# Patient Record
Sex: Male | Born: 1965 | Race: Black or African American | Hispanic: No | Smoking: Never smoker
Health system: Southern US, Community
[De-identification: ages and names within clinical notes are randomized; demographics above are authoritative.]

## PROBLEM LIST (undated history)

## (undated) DIAGNOSIS — E119 Type 2 diabetes mellitus without complications: Secondary | ICD-10-CM

## (undated) DIAGNOSIS — Z789 Other specified health status: Secondary | ICD-10-CM

## (undated) DIAGNOSIS — I10 Essential (primary) hypertension: Secondary | ICD-10-CM

## (undated) HISTORY — PX: NO PAST SURGERIES: SHX2092

---

## 2011-01-15 ENCOUNTER — Encounter: Payer: Self-pay | Admitting: Emergency Medicine

## 2011-01-15 ENCOUNTER — Emergency Department (HOSPITAL_COMMUNITY)
Admission: EM | Admit: 2011-01-15 | Discharge: 2011-01-15 | Disposition: A | Discharge status: Home / Self Care | Payer: BC Managed Care – PPO | Attending: Emergency Medicine | Admitting: Emergency Medicine

## 2011-01-15 DIAGNOSIS — N498 Inflammatory disorders of other specified male genital organs: Secondary | ICD-10-CM | POA: Insufficient documentation

## 2011-01-15 DIAGNOSIS — N492 Inflammatory disorders of scrotum: Secondary | ICD-10-CM

## 2011-01-15 MED ORDER — DOXYCYCLINE HYCLATE 100 MG PO CAPS: 100.0000 mg | ORAL_CAPSULE | Freq: Two times a day (BID) | ORAL | Status: AC

## 2011-01-15 MED ORDER — DOXYCYCLINE HYCLATE 100 MG PO TABS
100.0000 mg | ORAL_TABLET | Freq: Once | ORAL | Status: AC
  Administered 2011-01-15: 100 mg via ORAL
  Filled 2011-01-15: qty 1

## 2011-01-15 NOTE — ED Provider Notes (Signed)
History     CSN: [id number] Arrival date & time: 01/15/2011  8:36 AM  Chief Complaint  Patient presents with  . Abscess    (Consider location/radiation/quality/duration/timing/severity/associated sxs/prior treatment) HPI Comments: Pt states he mashed and popped the first lesion.  Another has since emerged.  Patient is a 45 y.o. male presenting with abscess. The history is provided by the patient. No language interpreter was used.  Abscess  This is a new problem. Episode onset: several days ago. The problem has been gradually worsening. Affected Location: scrotum. The problem is moderate. The abscess is characterized by swelling. Associated symptoms include a fever.    History reviewed. No pertinent past medical history.  History reviewed. No pertinent past surgical history.  History reviewed. No pertinent family history.  History  Substance Use Topics  . Smoking status: Not on file  . Smokeless tobacco: Not on file  . Alcohol Use: No      Review of Systems  Constitutional: Positive for fever.  Skin:       abscess  All other systems reviewed and are negative.    Allergies  Review of patient's allergies indicates no known allergies.  Home Medications   Current Outpatient Rx  Name Route Sig Dispense Refill  . ASPIRIN-SALICYLAMIDE-CAFFEINE 650-195-33.3 MG PO PACK Oral Take 1 packet by mouth every 4 (four) hours as needed. Pain       BP 144/97  Pulse 114  Temp(Src) 99.3 F (37.4 C) (Oral)  Resp 20  Ht 5\' 9"  (1.753 m)  Wt 195 lb (88.451 kg)  BMI 28.80 kg/m2  SpO2 100%  Physical Exam  Nursing note and vitals reviewed. Constitutional: He is oriented to person, place, and time. Vital signs are normal. He appears well-developed and well-nourished. No distress.  HENT:  Head: Normocephalic and atraumatic.  Right Ear: External ear normal.  Left Ear: External ear normal.  Nose: Nose normal.  Mouth/Throat: No oropharyngeal exudate.  Eyes: Conjunctivae and EOM  are normal. Pupils are equal, round, and reactive to light. Right eye exhibits no discharge. Left eye exhibits no discharge. No scleral icterus.  Neck: Normal range of motion. Neck supple. No JVD present. No tracheal deviation present. No thyromegaly present.  Cardiovascular: Normal rate, regular rhythm, normal heart sounds, intact distal pulses and normal pulses.  Exam reveals no gallop and no friction rub.   No murmur heard. Pulmonary/Chest: Effort normal and breath sounds normal. No stridor. No respiratory distress. He has no wheezes. He has no rales. He exhibits no tenderness.  Abdominal: Soft. Normal appearance and bowel sounds are normal. He exhibits no distension and no mass. There is no tenderness. There is no rebound and no guarding.  Genitourinary:        Musculoskeletal: Normal range of motion. He exhibits no edema and no tenderness.  Lymphadenopathy:    He has no cervical adenopathy.  Neurological: He is alert and oriented to person, place, and time. He has normal reflexes. Coordination normal. GCS eye subscore is 4. GCS verbal subscore is 5. GCS motor subscore is 6.  Skin: Skin is warm and dry. No rash noted. He is not diaphoretic.  Psychiatric: He has a normal mood and affect. His speech is normal and behavior is normal. Judgment and thought content normal. Cognition and memory are normal.    ED Course  Procedures (including critical care time)  Labs Reviewed - No data to display No results found.   No diagnosis found.    MDM  Worthy Rancher, [place] 01/15/11 [phone]

## 2011-01-15 NOTE — ED Provider Notes (Signed)
Evaluation and management procedures were performed by the mid-level provider (PA/NP/CNM) under my supervision/collaboration. I was present and available during the ED course. Miguel Carelock Y.   Gavin Pound. Oletta Lamas, MD 01/15/11 [phone]

## 2011-01-15 NOTE — ED Notes (Signed)
Pt reports boil under his scrotum that is the size of a "quarter".  Pt reports "it never got a head on it, so i couldn't pop it".  Pt is now febrile and tachycardic.

## 2011-01-15 NOTE — ED Notes (Signed)
Pt c/o knot near his scrotum since Saturday.

## 2011-01-24 ENCOUNTER — Other Ambulatory Visit (HOSPITAL_COMMUNITY): Payer: Self-pay | Admitting: Family Medicine

## 2011-01-24 DIAGNOSIS — R609 Edema, unspecified: Secondary | ICD-10-CM

## 2011-01-29 ENCOUNTER — Other Ambulatory Visit (HOSPITAL_COMMUNITY): Payer: Self-pay | Admitting: Family Medicine

## 2011-01-29 ENCOUNTER — Ambulatory Visit (HOSPITAL_COMMUNITY)
Admission: RE | Admit: 2011-01-29 | Discharge: 2011-01-29 | Disposition: A | Discharge status: Home / Self Care | Payer: BC Managed Care – PPO | Source: Ambulatory Visit | Attending: Family Medicine | Admitting: Family Medicine

## 2011-01-29 DIAGNOSIS — R609 Edema, unspecified: Secondary | ICD-10-CM

## 2011-01-29 DIAGNOSIS — N5089 Other specified disorders of the male genital organs: Secondary | ICD-10-CM | POA: Insufficient documentation

## 2011-07-08 ENCOUNTER — Other Ambulatory Visit (HOSPITAL_COMMUNITY): Payer: Self-pay | Admitting: Urology

## 2011-07-08 DIAGNOSIS — N50812 Left testicular pain: Secondary | ICD-10-CM

## 2011-07-10 ENCOUNTER — Ambulatory Visit (HOSPITAL_COMMUNITY)
Admission: RE | Admit: 2011-07-10 | Discharge: 2011-07-10 | Disposition: A | Discharge status: Home / Self Care | Payer: BC Managed Care – PPO | Source: Ambulatory Visit | Attending: Urology | Admitting: Urology

## 2011-07-10 ENCOUNTER — Other Ambulatory Visit (HOSPITAL_COMMUNITY): Payer: Self-pay | Admitting: Urology

## 2011-07-10 DIAGNOSIS — N508 Other specified disorders of male genital organs: Secondary | ICD-10-CM | POA: Insufficient documentation

## 2011-07-10 DIAGNOSIS — N433 Hydrocele, unspecified: Secondary | ICD-10-CM | POA: Insufficient documentation

## 2011-07-10 DIAGNOSIS — N50812 Left testicular pain: Secondary | ICD-10-CM

## 2015-09-11 ENCOUNTER — Ambulatory Visit (HOSPITAL_COMMUNITY)
Admission: EM | Admit: 2015-09-11 | Discharge: 2015-09-11 | Disposition: A | Discharge status: Home / Self Care | Payer: BLUE CROSS/BLUE SHIELD | Attending: Emergency Medicine | Admitting: Emergency Medicine

## 2015-09-11 ENCOUNTER — Encounter (HOSPITAL_COMMUNITY): Payer: Self-pay | Admitting: Emergency Medicine

## 2015-09-11 DIAGNOSIS — L03116 Cellulitis of left lower limb: Secondary | ICD-10-CM

## 2015-09-11 MED ORDER — CEFTRIAXONE SODIUM 1 G IJ SOLR
1.0000 g | Freq: Once | INTRAMUSCULAR | Status: AC
  Administered 2015-09-11: 1 g via INTRAMUSCULAR

## 2015-09-11 MED ORDER — CEFTRIAXONE SODIUM 1 G IJ SOLR
INTRAMUSCULAR | Status: AC
  Filled 2015-09-11: qty 10

## 2015-09-11 MED ORDER — SULFAMETHOXAZOLE-TRIMETHOPRIM 800-160 MG PO TABS: 1.0000 | ORAL_TABLET | Freq: Two times a day (BID) | ORAL | Status: DC

## 2015-09-11 MED ORDER — HYDROCODONE-ACETAMINOPHEN 5-325 MG PO TABS: 1.0000 | ORAL_TABLET | Freq: Four times a day (QID) | ORAL | Status: DC | PRN

## 2015-09-11 MED ORDER — CEPHALEXIN 500 MG PO CAPS: 500.0000 mg | ORAL_CAPSULE | Freq: Four times a day (QID) | ORAL | Status: DC

## 2015-09-11 NOTE — ED Notes (Signed)
Pt has swelling, redness and pain in his left upper medial leg and groin.  He noticed some pain in his groin on Monday.  Since then the pain and redness has spread.  The pt has a low grade fever of 101.7 today.

## 2015-09-11 NOTE — ED Provider Notes (Signed)
CSN: [id number]     Arrival date & time 09/11/15  1716 History   First MD Initiated Contact with Patient 09/11/15 1754     Chief Complaint  Patient presents with  . Leg Swelling   (Consider location/radiation/quality/duration/timing/severity/associated sxs/prior Treatment) HPI  He is a 50 year old man here for evaluation of left leg issue. He states this started yesterday morning with some pain and swelling in the left groin. He states now the left inner thigh is red and tender. It is worse up by the groin. No involvement of the scrotum. No nausea or vomiting. He does have a fever here of 101.7.  History reviewed. No pertinent past medical history. History reviewed. No pertinent past surgical history. History reviewed. No pertinent family history. Social History  Substance Use Topics  . Smoking status: Never Smoker   . Smokeless tobacco: None  . Alcohol Use: No    Review of Systems As in history of present illness Allergies  Review of patient's allergies indicates no known allergies.  Home Medications   Prior to Admission medications   Medication Sig Start Date End Date Taking? Authorizing Provider  Aspirin-Salicylamide-Caffeine (BC FAST PAIN RELIEF) 650-195-33.3 MG PACK Take 1 packet by mouth every 4 (four) hours as needed. Pain     Historical Provider, MD  cephALEXin (KEFLEX) 500 MG capsule Take 1 capsule (500 mg total) by mouth 4 (four) times daily. 09/11/15   Charm RingsErin J Kemp Gomes, MD  HYDROcodone-acetaminophen (NORCO) 5-325 MG tablet Take 1 tablet by mouth every 6 (six) hours as needed for moderate pain. 09/11/15   Charm RingsErin J Stefany Starace, MD  sulfamethoxazole-trimethoprim (BACTRIM DS,SEPTRA DS) 800-160 MG tablet Take 1 tablet by mouth 2 (two) times daily. 09/11/15 09/18/15  Charm RingsErin J Chaitanya Amedee, MD   Meds Ordered and Administered this Visit   Medications  cefTRIAXone (ROCEPHIN) injection 1 g (1 g Intramuscular Given 09/11/15 1830)    BP 195/89 mmHg  Pulse 105  Temp(Src) 101.7 F (38.7 C) (Oral)  Resp 16   SpO2 97% No data found.   Physical Exam  Constitutional: He is oriented to person, place, and time. He appears well-developed and well-nourished. No distress.  Cardiovascular: Normal rate.   Pulmonary/Chest: Effort normal.  Neurological: He is alert and oriented to person, place, and time.  Skin:  He has a 18 x 30 cm patch of erythema on the left inner thigh stretching from the groin crease to the knee. This is warm and swollen. There is an area of firm induration in the groin, but no fluctuance. No involvement of the scrotum.    ED Course  Procedures (including critical care time)  Labs Review Labs Reviewed - No data to display  Imaging Review No results found.   MDM   1. Cellulitis of left lower extremity    I have marked the borders of erythema. Shot of Rocephin given here. We'll treat with Keflex and Bactrim to cover MRSA given fever and rapid progression. If he has continued fevers or the erythema is spreading beyond the borders, he will go to the emergency room.    Charm RingsErin J Mason Burleigh, MD 09/11/15 [phone]

## 2015-09-11 NOTE — Discharge Instructions (Signed)
Cellulitis Cellulitis is an infection of the skin and the tissue under the skin. The infected area is usually red and tender. This happens most often in the arms and lower legs. HOME CARE   Take your antibiotic medicine as told. Finish the medicine even if you start to feel better.  Keep the infected arm or leg raised (elevated).  Put a warm cloth on the area up to 4 times per day.  Only take medicines as told by your doctor.  Keep all doctor visits as told. GET HELP IF:  You see red streaks on the skin coming from the infected area.  Your red area gets bigger or turns a dark color.  Your bone or joint under the infected area is painful after the skin heals.  Your infection comes back in the same area or different area.  You have a puffy (swollen) bump in the infected area.  You have new symptoms.  Your fever persists for more than 2 days GET HELP RIGHT AWAY IF:   You feel very sleepy.  You throw up (vomit) or have watery poop (diarrhea).  You feel sick and have muscle aches and pains.   This information is not intended to replace advice given to you by your health care provider. Make sure you discuss any questions you have with your health care provider.   Document Released: 09/10/2007 Document Revised: 12/13/2014 Document Reviewed: 06/09/2011 Elsevier Interactive Patient Education [employer]2016 [employer]vier Inc.

## 2015-09-14 ENCOUNTER — Encounter (HOSPITAL_COMMUNITY): Payer: Self-pay

## 2015-09-14 ENCOUNTER — Observation Stay (HOSPITAL_COMMUNITY): Payer: BLUE CROSS/BLUE SHIELD

## 2015-09-14 ENCOUNTER — Inpatient Hospital Stay (HOSPITAL_COMMUNITY)
Admission: EM | Admit: 2015-09-14 | Discharge: 2015-09-19 | DRG: 603 | Disposition: A | Discharge status: Home Health | Payer: BLUE CROSS/BLUE SHIELD | Attending: Internal Medicine | Admitting: Internal Medicine

## 2015-09-14 DIAGNOSIS — I10 Essential (primary) hypertension: Secondary | ICD-10-CM | POA: Diagnosis present

## 2015-09-14 DIAGNOSIS — E11628 Type 2 diabetes mellitus with other skin complications: Secondary | ICD-10-CM

## 2015-09-14 DIAGNOSIS — L03116 Cellulitis of left lower limb: Secondary | ICD-10-CM

## 2015-09-14 DIAGNOSIS — I129 Hypertensive chronic kidney disease with stage 1 through stage 4 chronic kidney disease, or unspecified chronic kidney disease: Secondary | ICD-10-CM | POA: Diagnosis present

## 2015-09-14 DIAGNOSIS — I159 Secondary hypertension, unspecified: Secondary | ICD-10-CM

## 2015-09-14 DIAGNOSIS — E1165 Type 2 diabetes mellitus with hyperglycemia: Secondary | ICD-10-CM | POA: Diagnosis present

## 2015-09-14 DIAGNOSIS — E1122 Type 2 diabetes mellitus with diabetic chronic kidney disease: Secondary | ICD-10-CM | POA: Diagnosis present

## 2015-09-14 DIAGNOSIS — E119 Type 2 diabetes mellitus without complications: Secondary | ICD-10-CM

## 2015-09-14 DIAGNOSIS — M7989 Other specified soft tissue disorders: Secondary | ICD-10-CM | POA: Diagnosis not present

## 2015-09-14 DIAGNOSIS — D72829 Elevated white blood cell count, unspecified: Secondary | ICD-10-CM

## 2015-09-14 DIAGNOSIS — R739 Hyperglycemia, unspecified: Secondary | ICD-10-CM | POA: Diagnosis not present

## 2015-09-14 DIAGNOSIS — N182 Chronic kidney disease, stage 2 (mild): Secondary | ICD-10-CM | POA: Diagnosis not present

## 2015-09-14 DIAGNOSIS — L02214 Cutaneous abscess of groin: Principal | ICD-10-CM | POA: Diagnosis present

## 2015-09-14 DIAGNOSIS — L0390 Cellulitis, unspecified: Secondary | ICD-10-CM

## 2015-09-14 HISTORY — DX: Other specified health status: Z78.9

## 2015-09-14 LAB — CBC WITH DIFFERENTIAL/PLATELET
BASOS ABS: 0 10*3/uL (ref 0.0–0.1)
BASOS PCT: 0 %
EOS ABS: 0.2 10*3/uL (ref 0.0–0.7)
Eosinophils Relative: 1 %
HEMATOCRIT: 38.1 % — AB (ref 39.0–52.0)
HEMOGLOBIN: 12.1 g/dL — AB (ref 13.0–17.0)
Lymphocytes Relative: 22 %
Lymphs Abs: 3.1 10*3/uL (ref 0.7–4.0)
MCH: 27.9 pg (ref 26.0–34.0)
MCHC: 31.8 g/dL (ref 30.0–36.0)
MCV: 87.8 fL (ref 78.0–100.0)
MONOS PCT: 9 %
Monocytes Absolute: 1.3 10*3/uL — ABNORMAL HIGH (ref 0.1–1.0)
NEUTROS ABS: 9.3 10*3/uL — AB (ref 1.7–7.7)
NEUTROS PCT: 67 %
PLATELETS: 321 10*3/uL (ref 150–400)
RBC: 4.34 MIL/uL (ref 4.22–5.81)
RDW: 11.9 % (ref 11.5–15.5)
WBC: 13.9 10*3/uL — AB (ref 4.0–10.5)

## 2015-09-14 LAB — BASIC METABOLIC PANEL
Anion gap: 9 (ref 5–15)
BUN: 11 mg/dL (ref 6–20)
CALCIUM: 8.6 mg/dL — AB (ref 8.9–10.3)
CO2: 25 mmol/L (ref 22–32)
CREATININE: 1.23 mg/dL (ref 0.61–1.24)
Chloride: 105 mmol/L (ref 101–111)
GFR calc Af Amer: >60 mL/min (ref >60)
GLUCOSE: 144 mg/dL — AB (ref 65–99)
Potassium: 3.6 mmol/L (ref 3.5–5.1)
Sodium: 139 mmol/L (ref 135–145)

## 2015-09-14 LAB — I-STAT CG4 LACTIC ACID, ED: Lactic Acid, Venous: 0.74 mmol/L (ref 0.5–2.0)

## 2015-09-14 LAB — MRSA PCR SCREENING: MRSA by PCR: NEGATIVE

## 2015-09-14 MED ORDER — CEFAZOLIN SODIUM 1-5 GM-% IV SOLN
1.0000 g | Freq: Once | INTRAVENOUS | Status: AC
  Administered 2015-09-14: 1 g via INTRAVENOUS
  Filled 2015-09-14: qty 50

## 2015-09-14 MED ORDER — HYDRALAZINE HCL 20 MG/ML IJ SOLN
5.0000 mg | INTRAMUSCULAR | Status: DC | PRN
  Administered 2015-09-14: 5 mg via INTRAVENOUS
  Filled 2015-09-14: qty 1

## 2015-09-14 MED ORDER — ONDANSETRON HCL 4 MG/2ML IJ SOLN
4.0000 mg | Freq: Four times a day (QID) | INTRAMUSCULAR | Status: DC | PRN
Start: 2015-09-14 — End: 2015-09-19

## 2015-09-14 MED ORDER — ENOXAPARIN SODIUM 40 MG/0.4ML ~~LOC~~ SOLN
40.0000 mg | SUBCUTANEOUS | Status: DC
Start: 2015-09-14 — End: 2015-09-19
  Administered 2015-09-14 – 2015-09-18 (×5): 40 mg via SUBCUTANEOUS
  Filled 2015-09-14 (×5): qty 0.4

## 2015-09-14 MED ORDER — ONDANSETRON HCL 4 MG PO TABS: 4.0000 mg | ORAL_TABLET | Freq: Four times a day (QID) | ORAL | Status: DC | PRN

## 2015-09-14 MED ORDER — CLINDAMYCIN PHOSPHATE 600 MG/50ML IV SOLN
600.0000 mg | Freq: Three times a day (TID) | INTRAVENOUS | Status: DC
  Administered 2015-09-14 – 2015-09-15 (×4): 600 mg via INTRAVENOUS
  Filled 2015-09-14 (×6): qty 50

## 2015-09-14 MED ORDER — SODIUM CHLORIDE 0.9 % IV BOLUS (SEPSIS)
1000.0000 mL | Freq: Once | INTRAVENOUS | Status: AC
  Administered 2015-09-14: 1000 mL via INTRAVENOUS

## 2015-09-14 MED ORDER — RISAQUAD PO CAPS
2.0000 | ORAL_CAPSULE | Freq: Every day | ORAL | Status: DC
  Administered 2015-09-14 – 2015-09-19 (×5): 2 via ORAL
  Filled 2015-09-14 (×5): qty 2

## 2015-09-14 MED ORDER — ONDANSETRON HCL 4 MG/2ML IJ SOLN: 4.0000 mg | Freq: Three times a day (TID) | INTRAMUSCULAR | Status: AC | PRN

## 2015-09-14 MED ORDER — HYDROCODONE-ACETAMINOPHEN 5-325 MG PO TABS
1.0000 | ORAL_TABLET | Freq: Four times a day (QID) | ORAL | Status: DC | PRN
  Administered 2015-09-15 – 2015-09-16 (×2): 1 via ORAL
  Filled 2015-09-14 (×2): qty 1

## 2015-09-14 MED ORDER — SODIUM CHLORIDE 0.45 % IV SOLN
INTRAVENOUS | Status: AC
  Administered 2015-09-14 – 2015-09-15 (×3): via INTRAVENOUS

## 2015-09-14 MED ORDER — MORPHINE SULFATE (PF) 4 MG/ML IV SOLN
4.0000 mg | Freq: Once | INTRAVENOUS | Status: AC
  Administered 2015-09-14: 4 mg via INTRAVENOUS
  Filled 2015-09-14: qty 1

## 2015-09-14 NOTE — ED Notes (Signed)
Delay in medications/antibiotics-pharmacy has not verified medications yet

## 2015-09-14 NOTE — ED Notes (Signed)
Message sent to pharmacy about need for med verification

## 2015-09-14 NOTE — H&P (Signed)
History and Physical    Miguel ClarkWayne J Quiett BJY:[id number] DOB: February 19, 1966 DOA: 09/14/2015  PCP: No PCP Per Patient Patient coming from: home  Chief Complaint: L leg pain and redness  HPI: Miguel Rice is a 50 y.o. male with no significant past medical history. That being said patient states he only goes to the doctor about every 2 years due to requirements for work. Occasionally has high blood pressure during these checks but has never been formally diagnosed with high blood pressure or any other medical condition. Patient presenting with 4 day history of left leg swelling, redness, and pain. Symptoms started on 09/10/2015 with a slight hypersensitivity to the left proximal medial thigh. This rapidly progressed to redness and tenderness and swelling. [facility] where he was given a 1 mg dose of Rocephin IM and started on Keflex and Bactrim. Patient states he's been compliant with these medications and while they have helped somewhat with the swelling, the redness and central area of induration has gotten worse. Patient's initial area of erythema/cellulitis was marked with a skin marking pen and the redness has traveled several inches outside of this. At the time of presentation to the urgent care patient was febrile to 101.7. Patient states she's never felt febrile and has also never checked his temperature before that or since. Patient takes Norco for the pain which works fairly well. Patient denies any chest pain, shortness of breath, palpitations, other rashes, tick bites, dysuria, penile discharge, penile lesions, groin adenopathy.  Additionally patient has tried soaking his leg and Epsom salt which he thinks has helped with the swelling.   ED Course: Bedside ultrasound without evidence of abscess. Objective findings as outlined below. Patient hypertensive at time of presentation. Patient started on Ancef, given morphine and a 1 L normal saline  bolus  Review of Systems: As per HPI otherwise 10 point review of systems negative.   Ambulatory Status: no restrictions  Past Medical History  Diagnosis Date  . Medical history non-contributory     Past Surgical History  Procedure Laterality Date  . No past surgeries      Social History   Social History  . Marital Status: Married    Spouse Name: N/A  . Number of Children: N/A  . Years of Education: N/A   Occupational History  . Not on file.   Social History Main Topics  . Smoking status: Never Smoker   . Smokeless tobacco: Not on file  . Alcohol Use: 0.0 oz/week    0 Standard drinks or equivalent per week  . Drug Use: No  . Sexual Activity: Not on file   Other Topics Concern  . Not on file   Social History Narrative    No Known Allergies  Family History  Problem Relation Age of Onset  . Diabetes    . Hypertension      Prior to Admission medications   Medication Sig Start Date End Date Taking? Authorizing Provider  Aspirin-Salicylamide-Caffeine (BC FAST PAIN RELIEF) 650-195-33.3 MG PACK Take 1 packet by mouth every 4 (four) hours as needed. Pain     Historical Provider, MD  cephALEXin (KEFLEX) 500 MG capsule Take 1 capsule (500 mg total) by mouth 4 (four) times daily. 09/11/15   Charm RingsErin J Honig, MD  HYDROcodone-acetaminophen (NORCO) 5-325 MG tablet Take 1 tablet by mouth every 6 (six) hours as needed for moderate pain. 09/11/15   Charm RingsErin J Honig, MD  sulfamethoxazole-trimethoprim (BACTRIM DS,SEPTRA DS)  800-160 MG tablet Take 1 tablet by mouth 2 (two) times daily. 09/11/15 09/18/15  Charm Rings, MD    Physical Exam: Filed Vitals:   09/14/15 1610 09/14/15 0627 09/14/15 0630 09/14/15 0646  BP: 184/110  176/106   Pulse: 79  68   Temp:    98.3 F (36.8 C)  TempSrc:    Oral  Resp: 20     SpO2: 100% 100% 100%      General:  Appears calm and comfortable Eyes:  PERRL, EOMI, normal lids, iris ENT:  grossly normal hearing, lips & tongue, mmm Neck:  no LAD, masses or  thyromegaly Cardiovascular:  RRR, no m/r/g. No LE edema.  Respiratory:  CTA bilaterally, no w/r/r. Normal respiratory effort. Abdomen:  soft, ntnd, NABS Skin:  Left medial and posterior proximal thigh with significant erythema, induration and tenderness. Most proximally and medially patient with baseball size area of induration. No fluctuance felt.  Musculoskeletal:  grossly normal tone BUE/BLE, good ROM, no bony abnormality Psychiatric:  grossly normal mood and affect, speech fluent and appropriate, AOx3 Neurologic:  CN 2-12 grossly intact, moves all extremities in coordinated fashion, sensation intact  Labs on Admission: I have personally reviewed following labs and imaging studies  CBC:  Recent Labs Lab 09/14/15 0648  WBC 13.9*  NEUTROABS 9.3*  HGB 12.1*  HCT 38.1*  MCV 87.8  PLT 321   Basic Metabolic Panel:  Recent Labs Lab 09/14/15 0648  NA 139  K 3.6  CL 105  CO2 25  GLUCOSE 144*  BUN 11  CREATININE 1.23  CALCIUM 8.6*   GFR: CrCl cannot be calculated (Unknown ideal weight.). Liver Function Tests: No results for input(s): AST, ALT, ALKPHOS, BILITOT, PROT, ALBUMIN in the last 168 hours. No results for input(s): LIPASE, AMYLASE in the last 168 hours. No results for input(s): AMMONIA in the last 168 hours. Coagulation Profile: No results for input(s): INR, PROTIME in the last 168 hours. Cardiac Enzymes: No results for input(s): CKTOTAL, CKMB, CKMBINDEX, TROPONINI in the last 168 hours. BNP (last 3 results) No results for input(s): PROBNP in the last 8760 hours. HbA1C: No results for input(s): HGBA1C in the last 72 hours. CBG: No results for input(s): GLUCAP in the last 168 hours. Lipid Profile: No results for input(s): CHOL, HDL, LDLCALC, TRIG, CHOLHDL, LDLDIRECT in the last 72 hours. Thyroid Function Tests: No results for input(s): TSH, T4TOTAL, FREET4, T3FREE, THYROIDAB in the last 72 hours. Anemia Panel: No results for input(s): VITAMINB12, FOLATE,  FERRITIN, TIBC, IRON, RETICCTPCT in the last 72 hours. Urine analysis: No results found for: COLORURINE, APPEARANCEUR, LABSPEC, PHURINE, GLUCOSEU, HGBUR, BILIRUBINUR, KETONESUR, PROTEINUR, UROBILINOGEN, NITRITE, LEUKOCYTESUR  Creatinine Clearance: CrCl cannot be calculated (Unknown ideal weight.).  Sepsis Labs: (procalcitonin:4,lacticidven:4) )No results found for this or any previous visit (from the past 240 hour(s)).   Radiological Exams on Admission: No results found.    Assessment/Plan Active Problems:   Cellulitis   Hypertension   Hyperglycemia   Leukocytosis   CKD (chronic kidney disease), stage II   Cellulitis: Failed outpatient therapies of Rocephin, Keflex, Bactrim. Rapidly progressive concerning for MRSA. Bedside ultrasound without evidence of abscess or cannot fully exclude at this time. Lactic acid 0.7, afebrile, WBC 13.9 with left shift received 1 L normal saline bolus and Ancef in ED. - Med surge - DC Ancef in favor of IV Clindamycin for MRSA coverage. probiotic - BCX (ancef given prior to [facility]), HIV - [place] LLE to better evaluate possibliity of abscess - surgical consult  if abscess present - IVF 1/2NS 170ml/hr x24hrs - continue norco  HTN: Markedly elevated BP. Likely in part from pain but also likely from undiagnosed HTN as pt only goes to doctor every 2 years at best.  - Hydralazine prn - If remains elevated, consider starting HCTZ or ACEi prior to DC  Hyperglycemia: 144 on admission. Strong fmhx of DM and pt has not been checked for DM. - A1c - CBG Q8  CKD II: GFR 78. Technically in nml range but pt at risk for progressive renal insufficiency given lack of medical care and HTN and possible DM.  - IVF - BMET in am - workup as above.   DVT prophylaxis: Lovenox  Code Status: FULL  Family Communication: none  Disposition Plan: pending improvement, anticipate DC on 09/15/15  Consults called: none  Admission status: medsurge obs    Kellen Hover  J MD [facility]  If 7PM-7AM, please contact night-coverage [email or web address] Password [email or web address]  09/14/2015, 9:17 AM

## 2015-09-14 NOTE — Progress Notes (Signed)
Patient arrived to [facility] floor from ED. Alert and oriented.

## 2015-09-14 NOTE — ED Provider Notes (Signed)
Medical screening examination/treatment/procedure(s) were conducted as a shared visit with non-physician practitioner(s) and myself.  I personally evaluated the patient during the encounter.   EKG Interpretation None        50 year old male who presents with left thigh cellulitis. He is otherwise healthy. States that about 3-4 days ago he noticed a small boil in the left inner thigh. Over 24 hours area has become erythematous, indurated, more painful, and spread over the left inner thigh. He initially went to urgent care 2 days ago. He had a fever at that time. Was started on Keflex and Bactrim. Over the past day he has noted that the swelling has gone down, but redness has been spreading past the line of demarcation over the left thigh that was initially placed at urgent care. Denies subjective fevers or chills, nausea or vomiting, diarrhea, shortness of breath or chest pain. He is afebrile here, without systemic signs or symptoms of sepsis. Does have a leukocytosis of 13. There is a large area of erythema, induration, warmth overlying the medial aspect of the left thigh. Bedside ultrasound without appreciable abcess. Failure of outpatient antibiotics. Will plan to broaden abx and admit for observation.  Lavera Guiseana Duo [place]tawan Mchugh, MD 09/14/15 [phone]

## 2015-09-14 NOTE — ED Notes (Signed)
Admitting at bedside 

## 2015-09-14 NOTE — H&P (Signed)
Blood sugar 144 , patient not officially  diagnosed with Diabetes at this time .

## 2015-09-14 NOTE — ED Notes (Signed)
Attempted report 

## 2015-09-14 NOTE — ED Provider Notes (Signed)
CSN: [id number]     Arrival date & time 09/14/15  0620 History   First MD Initiated Contact with Patient 09/14/15 [phone]     Chief Complaint  Patient presents with  . Insect Bite     (Consider location/radiation/quality/duration/timing/severity/associated sxs/prior Treatment) HPI   Miguel Rice is a 50 y.o. male, patient with no pertinent past medical history, presenting to the ED with a painful, swollen area to the left inner thigh since June 5. Pt was seen at urgent care on June 6, diagnosed with cellulitis and given Bactrim and Keflex, as well as IM Rocephin there in the clinic. Pt states he has been taking the ABX as prescribed. Pt rates his pain at 7/10, sharp, nonradiating. States the redness is moving outside of the previously drawn lines. Last tylenol was around 2300 last night. Pt denies current fever/chills, N/V, abdominal pain, urinary issues, or any other complaints.      History reviewed. No pertinent past medical history. History reviewed. No pertinent past surgical history. History reviewed. No pertinent family history. Social History  Substance Use Topics  . Smoking status: Never Smoker   . Smokeless tobacco: None  . Alcohol Use: No    Review of Systems  Constitutional: Negative for fever, chills and diaphoresis.  Gastrointestinal: Negative for nausea, vomiting and abdominal pain.  Genitourinary: Negative for dysuria, hematuria and flank pain.  Skin: Positive for color change.       Redness and tenderness to left inner thigh.  Neurological: Negative for dizziness, weakness and light-headedness.  All other systems reviewed and are negative.     Allergies  Review of patient's allergies indicates no known allergies.  Home Medications   Prior to Admission medications   Medication Sig Start Date End Date Taking? Authorizing Provider  Aspirin-Salicylamide-Caffeine (BC FAST PAIN RELIEF) [phone] MG PACK Take 1 packet by mouth every 4 (four) hours as needed.  Pain     Historical Provider, MD  cephALEXin (KEFLEX) 500 MG capsule Take 1 capsule (500 mg total) by mouth 4 (four) times daily. 09/11/15   Charm RingsErin J Honig, MD  HYDROcodone-acetaminophen (NORCO) 5-325 MG tablet Take 1 tablet by mouth every 6 (six) hours as needed for moderate pain. 09/11/15   Charm RingsErin J Honig, MD  sulfamethoxazole-trimethoprim (BACTRIM DS,SEPTRA DS) 800-160 MG tablet Take 1 tablet by mouth 2 (two) times daily. 09/11/15 09/18/15  Charm RingsErin J Honig, MD   BP 176/106 mmHg  Pulse 68  Temp(Src) 98.3 F (36.8 C) (Oral)  Resp 20  SpO2 100% Physical Exam  Constitutional: He appears well-developed and well-nourished. No distress.  HENT:  Head: Normocephalic and atraumatic.  Eyes: Conjunctivae are normal.  Neck: Neck supple.  Cardiovascular: Normal rate, regular rhythm, normal heart sounds and intact distal pulses.   Pulmonary/Chest: Effort normal and breath sounds normal. No respiratory distress.  Abdominal: Soft. There is no tenderness. There is no guarding.  Musculoskeletal: He exhibits no edema or tenderness.  Lymphadenopathy:    He has no cervical adenopathy.  Neurological: He is alert.  Skin: Skin is warm and dry. He is not diaphoretic.  Patient has a large, raised approximately 7x11 cm area of tenderness, swelling and induration to the left inner thigh with erythema extending into the left inguinal region superiorly and down to the knee inferiorly. Erythema has progressed outside the previously drawn skin marking lines. Area is warm to the touch.  Psychiatric: He has a normal mood and affect. His behavior is normal.  Nursing note and vitals reviewed.  ED Course  Procedures (including critical care time)  EMERGENCY DEPARTMENT US SOFT TISSUE INTERPRETATION "Study: Limited Ultrasound of the noted body part in comments below"  INDICATIONS: Soft tissue infection Multiple views of the body part are obtained with a multi-frequency linear probe  PERFORMED BY:  Myself  IMAGES ARCHIVED?:  Yes  SIDE:Left  BODY PART:Lower extremity  FINDINGS: No abcess noted and Cellulitis present  LIMITATIONS:   INTERPRETATION:  No abcess noted and Cellulitis present  COMMENT:  Left medial thigh   Labs Review Labs Reviewed  CBC WITH DIFFERENTIAL/PLATELET - Abnormal; Notable for the following:    WBC 13.9 (*)    Hemoglobin 12.1 (*)    HCT 38.1 (*)    Neutro Abs 9.3 (*)    Monocytes Absolute 1.3 (*)    All other components within normal limits  BASIC METABOLIC PANEL - Abnormal; Notable for the following:    Glucose, Bld 144 (*)    Calcium 8.6 (*)    All other components within normal limits  MRSA PCR SCREENING  I-STAT CG4 LACTIC ACID, ED    Imaging Review No results found. I have personally reviewed and evaluated these lab results as part of my medical decision-making.   EKG Interpretation None       Medications  sodium chloride 0.9 % bolus 1,000 mL (1,000 mLs Intravenous New Bag/Given 09/14/15 0710)  morphine 4 MG/ML injection 4 mg (4 mg Intravenous Given 09/14/15 0708)  ceFAZolin (ANCEF) IVPB 1 g/50 mL premix (0 g Intravenous Stopped 09/14/15 0740)   Orders Placed This Encounter  Procedures  . MRSA PCR Screening  . CBC with Differential  . Basic metabolic panel  . Check temperature  . Nursing communication  . Consult to hospitalist  . I-Stat CG4 Lactic Acid, ED  . Insert peripheral IV  . Place in observation (patient's expected length of stay will be less than 2 midnights)    MDM   Final diagnoses:  Cellulitis of left lower extremity    DIJON COSENS presents with an area of redness and tenderness to the left inner thigh for the last 4 days.  Findings and plan of care discussed with Crista Curb, MD. Dr. Verdie Mosher personally evaluated and examined this patient.  Large area of cellulitis with failed outpatient therapy. Patient is nontoxic appearing, afebrile, not tachycardic, not tachypneic, not hypotensive, and is in no apparent distress. Patient has no signs of  sepsis or other serious or life-threatening condition. Patient's HTN is noted and patient states he has never been diagnosed with high blood pressure.  8:32 AM Spoke with Dr. Konrad Dolores, hospitalist, Who agreed to admit the patient to MedSurg observation.  Filed Vitals:   09/14/15 0627 09/14/15 0627 09/14/15 0630 09/14/15 0646  BP: 184/110  176/106   Pulse: 79  68   Temp:    98.3 F (36.8 C)  TempSrc:    Oral  Resp: 20     SpO2: 100% 100% 100%      Anselm Pancoast, PA-C 09/14/15 0841  Lavera Guise, MD 09/21/15 [phone]

## 2015-09-14 NOTE — ED Notes (Signed)
Pt from home pt states on Monday he thinks he got bit by an insect on the inner aspect of the upper left leg. Pt states he has been to urgent care and they gave him antibiotics and pain medicine but it did not help.

## 2015-09-15 DIAGNOSIS — D72829 Elevated white blood cell count, unspecified: Secondary | ICD-10-CM | POA: Diagnosis not present

## 2015-09-15 DIAGNOSIS — I10 Essential (primary) hypertension: Secondary | ICD-10-CM | POA: Diagnosis not present

## 2015-09-15 DIAGNOSIS — R739 Hyperglycemia, unspecified: Secondary | ICD-10-CM | POA: Diagnosis not present

## 2015-09-15 DIAGNOSIS — L03116 Cellulitis of left lower limb: Secondary | ICD-10-CM | POA: Diagnosis not present

## 2015-09-15 LAB — BASIC METABOLIC PANEL
ANION GAP: 5 (ref 5–15)
BUN: 9 mg/dL (ref 6–20)
CALCIUM: 8.4 mg/dL — AB (ref 8.9–10.3)
CO2: 24 mmol/L (ref 22–32)
Chloride: 109 mmol/L (ref 101–111)
Creatinine, Ser: 1.13 mg/dL (ref 0.61–1.24)
GFR calc Af Amer: >60 mL/min (ref >60)
GLUCOSE: 135 mg/dL — AB (ref 65–99)
POTASSIUM: 3.6 mmol/L (ref 3.5–5.1)
Sodium: 138 mmol/L (ref 135–145)

## 2015-09-15 LAB — CBC
HEMATOCRIT: 36.3 % — AB (ref 39.0–52.0)
Hemoglobin: 11.3 g/dL — ABNORMAL LOW (ref 13.0–17.0)
MCH: 27.4 pg (ref 26.0–34.0)
MCHC: 31.1 g/dL (ref 30.0–36.0)
MCV: 88.1 fL (ref 78.0–100.0)
Platelets: 330 10*3/uL (ref 150–400)
RBC: 4.12 MIL/uL — ABNORMAL LOW (ref 4.22–5.81)
RDW: 12.1 % (ref 11.5–15.5)
WBC: 10.4 10*3/uL (ref 4.0–10.5)

## 2015-09-15 LAB — GLUCOSE, CAPILLARY
Glucose-Capillary: 221 mg/dL — ABNORMAL HIGH (ref 65–99)
Glucose-Capillary: 172 mg/dL — ABNORMAL HIGH (ref 65–99)
Glucose-Capillary: 124 mg/dL — ABNORMAL HIGH (ref 65–99)

## 2015-09-15 MED ORDER — SODIUM CHLORIDE 0.9 % IV SOLN
1750.0000 mg | Freq: Once | INTRAVENOUS | Status: AC
  Administered 2015-09-15: 1750 mg via INTRAVENOUS
  Filled 2015-09-15: qty 1750

## 2015-09-15 MED ORDER — AMLODIPINE BESYLATE 5 MG PO TABS
5.0000 mg | ORAL_TABLET | Freq: Every day | ORAL | Status: DC
  Administered 2015-09-15 – 2015-09-18 (×3): 5 mg via ORAL
  Filled 2015-09-15 (×3): qty 1

## 2015-09-15 MED ORDER — HYDROCHLOROTHIAZIDE 12.5 MG PO CAPS
12.5000 mg | ORAL_CAPSULE | Freq: Every day | ORAL | Status: DC
  Administered 2015-09-15 – 2015-09-19 (×4): 12.5 mg via ORAL
  Filled 2015-09-15 (×4): qty 1

## 2015-09-15 MED ORDER — VANCOMYCIN HCL IN DEXTROSE 1-5 GM/200ML-% IV SOLN
1000.0000 mg | Freq: Two times a day (BID) | INTRAVENOUS | Status: DC
  Administered 2015-09-16 – 2015-09-18 (×5): 1000 mg via INTRAVENOUS
  Filled 2015-09-15 (×6): qty 200

## 2015-09-15 MED ORDER — LISINOPRIL 10 MG PO TABS
10.0000 mg | ORAL_TABLET | Freq: Every day | ORAL | Status: DC
  Administered 2015-09-15 – 2015-09-19 (×4): 10 mg via ORAL
  Filled 2015-09-15 (×4): qty 1

## 2015-09-15 MED ORDER — INSULIN ASPART 100 UNIT/ML ~~LOC~~ SOLN: 0.0000 [IU] | Freq: Every day | SUBCUTANEOUS | Status: DC

## 2015-09-15 MED ORDER — INSULIN ASPART 100 UNIT/ML ~~LOC~~ SOLN
0.0000 [IU] | Freq: Three times a day (TID) | SUBCUTANEOUS | Status: DC
  Administered 2015-09-15: 3 [IU] via SUBCUTANEOUS
  Administered 2015-09-16 (×2): 2 [IU] via SUBCUTANEOUS
  Administered 2015-09-16: 5 [IU] via SUBCUTANEOUS
  Administered 2015-09-17 – 2015-09-18 (×4): 3 [IU] via SUBCUTANEOUS
  Administered 2015-09-18 – 2015-09-19 (×2): 2 [IU] via SUBCUTANEOUS

## 2015-09-15 NOTE — Progress Notes (Signed)
PROGRESS NOTE   Miguel ClarkWayne J Bratcher  WUJ:[id number]  DOB: [birth year]  DOA: 09/14/2015 PCP: No PCP Per Patient  Hospital course: Miguel Rice is a 50 y.o. male with no significant past medical history. That being said patient states he only goes to the doctor about every 2 years due to requirements for work. Occasionally has high blood pressure during these checks but has never been formally diagnosed with high blood pressure or any other medical condition. Patient presenting with 4 day history of left leg swelling, redness, and pain. Symptoms started on 09/10/2015 with a slight hypersensitivity to the left proximal medial thigh. This rapidly progressed to redness and tenderness and swelling. Patient seen on 09/11/2015 at [facility] where he was given a 1 mg dose of Rocephin IM and started on Keflex and Bactrim. Patient states he's been compliant with these medications and while they have helped somewhat with the swelling, the redness and central area of induration has gotten worse. Patient's initial area of erythema/cellulitis was marked with a skin marking pen and the redness has traveled several inches outside of this. At the time of presentation to the urgent care patient was febrile to 101.7. Patient states she's never felt febrile and has also never checked his temperature before that or since. Patient takes Norco for the pain which works fairly well. Patient denies any chest pain, shortness of breath, palpitations, other rashes, tick bites, dysuria, penile discharge, penile lesions, groin adenopathy.  Additionally patient has tried soaking his leg and Epsom salt which he thinks has helped with the swelling.   Assessment & Plan: 1.  cellulitis - Pt is having some improvement but still having pain and large indurated area very painful, US neg for abscess, will change to IV vancomycin and treat over next 24 hours, reassess tomorrow.  2. Hypertension, suboptimally controlled - adding  amlodipine, hct and lisinopril.  Following. 3. Hyperglycemia - suspect diabetes, checking A1c, results still pending at this time.  Antimicrobials:  clindamycin  Subjective: Pt having improvement but still having pain in large indurated area near groin  Objective: Filed Vitals:   09/14/15 1048 09/14/15 1138 09/14/15 2025 09/15/15 0618  BP: 171/116 165/98 168/99 155/78  Pulse: 73 74 82 88  Temp:  98.3 F (36.8 C) 98.1 F (36.7 C) 98.2 F (36.8 C)  TempSrc:  Oral Oral Oral  Resp: 16 17 19 19   Height:  5\' 10"  (1.778 m)    Weight:  194 lb 7.1 oz (88.2 kg)    SpO2: 100% 100% 100% 100%    Intake/Output Summary (Last 24 hours) at 09/15/15 1217 Last data filed at 09/15/15 [id number]  Gross per 24 hour  Intake    480 ml  Output      0 ml  Net    480 ml   Filed Weights   09/14/15 1138  Weight: 194 lb 7.1 oz (88.2 kg)   Exam:  General: Appears calm and comfortable  Eyes: PERRL, EOMI, normal lids, iris  ENT: grossly normal hearing, lips & tongue, mmm  Neck: no LAD, masses or thyromegaly  Cardiovascular: RRR, no m/r/g. No LE edema.   Respiratory: CTA bilaterally, no w/r/r. Normal respiratory effort.  Abdomen: soft, ntnd, NABS  Skin: Left medial and posterior proximal thigh erythema diminished, persistent induration and tenderness. Most proximally and medially patient with baseball size area of induration.   Musculoskeletal: grossly normal tone BUE/BLE, good ROM, no bony abnormality  Psychiatric: grossly normal mood and affect, speech fluent  and appropriate, AOx3  Neurologic: CN 2-12 grossly intact, moves all extremities in coordinated fashion, sensation intact  Data Reviewed: Basic Metabolic Panel:  Recent Labs Lab 10/05/2015 0648 09/15/15 0355  NA 139 138  K 3.6 3.6  CL 105 109  CO2 25 24  GLUCOSE 144* 135*  BUN 11 9  CREATININE 1.23 1.13  CALCIUM 8.6* 8.4*   Liver Function Tests: No results for input(s): AST, ALT, ALKPHOS, BILITOT, PROT, ALBUMIN  in the last 168 hours. No results for input(s): LIPASE, AMYLASE in the last 168 hours. No results for input(s): AMMONIA in the last 168 hours. CBC:  Recent Labs Lab October 05, 2015 0648 09/15/15 0355  WBC 13.9* 10.4  NEUTROABS 9.3*  --   HGB 12.1* 11.3*  HCT 38.1* 36.3*  MCV 87.8 88.1  PLT 321 330   Cardiac Enzymes: No results for input(s): CKTOTAL, CKMB, CKMBINDEX, TROPONINI in the last 168 hours. BNP (last 3 results) No results for input(s): PROBNP in the last 8760 hours. CBG:  Recent Labs Lab 09/15/15 0909  GLUCAP 221*    Recent Results (from the past 240 hour(s))  MRSA PCR Screening     Status: None   Collection Time: 05-Oct-2015  5:20 PM  Result Value Ref Range Status   MRSA by PCR NEGATIVE NEGATIVE Final    Comment:        The GeneXpert MRSA Assay (FDA approved for NASAL specimens only), is one component of a comprehensive MRSA colonization surveillance program. It is not intended to diagnose MRSA infection nor to guide or monitor treatment for MRSA infections.     Studies: [place] Extrem Low Left Ltd  2015/10/05  CLINICAL DATA:  Left proximal medial thigh pain and swelling for 5 days. EXAM: ULTRASOUND left LOWER EXTREMITY LIMITED TECHNIQUE: Ultrasound examination of the lower extremity soft tissues was performed in the area of clinical concern. COMPARISON:  None. FINDINGS: There is edema/ inflammation in the subcutaneous fat along with streaky areas of probable fluid. No discrete drainable fluid collection to suggest an abscess. IMPRESSION: Diffuse edema/ inflammation/fluid in the subcutaneous fat without discrete drainable fluid collection/abscess. Electronically Signed   By: Rudie Meyer M.D.   On: 2015/10/05 10:33   Scheduled Meds: . acidophilus  2 capsule Oral Daily  . enoxaparin (LOVENOX) injection  40 mg Subcutaneous Q24H   Continuous Infusions:   Active Problems:   Cellulitis   Hypertension   Hyperglycemia   Leukocytosis   CKD (chronic kidney disease), stage  II  Time spent:   Standley Dakins, MD, FAAFP [facility] Pager [phone]  If 7PM-7AM, please contact night-coverage [email or web address] Password [facility] 09/15/2015, 12:17 PM

## 2015-09-15 NOTE — Progress Notes (Signed)
Pharmacy Antibiotic Note  Miguel Rice is a 50 y.o. male admitted on 09/14/2015 with 4 day history of leg swelling, redness and pain. He was started on Keflex and Bactrim outpatient with little improvement. Pharmacy has been consulted for Vancomycin dosing for cellulitis.  WBC wnl. Afebrile. LA 0.74. CrCl ~ 85 mL/min   Plan: -Vancomycin 1750 mg IV load followed by Vancomycin 1 gm IV Q 12 hours. Vancomycin trough goal 10-15  -VT as indicated   Height: 5\' 10"  (177.8 cm) Weight: 194 lb 7.1 oz (88.2 kg) IBW/kg (Calculated) : 73  Temp (24hrs), Avg:98.2 F (36.8 C), Min:98.1 F (36.7 C), Max:98.2 F (36.8 C)   Recent Labs Lab 09/14/15 0648 09/14/15 0653 09/15/15 0355  WBC 13.9*  --  10.4  CREATININE 1.23  --  1.13  LATICACIDVEN  --  0.74  --     Estimated Creatinine Clearance: 86.5 mL/min (by C-G formula based on Cr of 1.13).    No Known Allergies  Antimicrobials this admission: 6/10 Vancomycin>>   Dose adjustments this admission: None   Microbiology results: BCx2>>   Thank you for allowing pharmacy to be a part of this patient's care.  Vinnie LevelBenjamin Venba Zenner, PharmD., BCPS Clinical Pharmacist Pager [phone]

## 2015-09-16 DIAGNOSIS — R739 Hyperglycemia, unspecified: Secondary | ICD-10-CM | POA: Diagnosis not present

## 2015-09-16 DIAGNOSIS — I10 Essential (primary) hypertension: Secondary | ICD-10-CM | POA: Diagnosis not present

## 2015-09-16 DIAGNOSIS — N182 Chronic kidney disease, stage 2 (mild): Secondary | ICD-10-CM | POA: Diagnosis present

## 2015-09-16 DIAGNOSIS — E1165 Type 2 diabetes mellitus with hyperglycemia: Secondary | ICD-10-CM | POA: Diagnosis present

## 2015-09-16 DIAGNOSIS — D72829 Elevated white blood cell count, unspecified: Secondary | ICD-10-CM | POA: Diagnosis not present

## 2015-09-16 DIAGNOSIS — M7989 Other specified soft tissue disorders: Secondary | ICD-10-CM | POA: Diagnosis present

## 2015-09-16 DIAGNOSIS — E11628 Type 2 diabetes mellitus with other skin complications: Secondary | ICD-10-CM | POA: Diagnosis not present

## 2015-09-16 DIAGNOSIS — L03116 Cellulitis of left lower limb: Secondary | ICD-10-CM | POA: Diagnosis not present

## 2015-09-16 DIAGNOSIS — I129 Hypertensive chronic kidney disease with stage 1 through stage 4 chronic kidney disease, or unspecified chronic kidney disease: Secondary | ICD-10-CM | POA: Diagnosis present

## 2015-09-16 DIAGNOSIS — L02214 Cutaneous abscess of groin: Secondary | ICD-10-CM | POA: Diagnosis present

## 2015-09-16 DIAGNOSIS — E1122 Type 2 diabetes mellitus with diabetic chronic kidney disease: Secondary | ICD-10-CM | POA: Diagnosis present

## 2015-09-16 LAB — CBC
HEMATOCRIT: 38.7 % — AB (ref 39.0–52.0)
HEMOGLOBIN: 12.2 g/dL — AB (ref 13.0–17.0)
MCH: 28.2 pg (ref 26.0–34.0)
MCHC: 31.5 g/dL (ref 30.0–36.0)
MCV: 89.4 fL (ref 78.0–100.0)
Platelets: 343 10*3/uL (ref 150–400)
RBC: 4.33 MIL/uL (ref 4.22–5.81)
RDW: 12.1 % (ref 11.5–15.5)
WBC: 11.2 10*3/uL — AB (ref 4.0–10.5)

## 2015-09-16 LAB — BASIC METABOLIC PANEL
ANION GAP: 5 (ref 5–15)
BUN: 10 mg/dL (ref 6–20)
CALCIUM: 8.9 mg/dL (ref 8.9–10.3)
CHLORIDE: 106 mmol/L (ref 101–111)
CO2: 26 mmol/L (ref 22–32)
CREATININE: 1.3 mg/dL — AB (ref 0.61–1.24)
GFR calc non Af Amer: >60 mL/min (ref >60)
Glucose, Bld: 134 mg/dL — ABNORMAL HIGH (ref 65–99)
Potassium: 3.6 mmol/L (ref 3.5–5.1)
SODIUM: 137 mmol/L (ref 135–145)

## 2015-09-16 LAB — HIV ANTIBODY (ROUTINE TESTING W REFLEX): HIV Screen 4th Generation wRfx: NONREACTIVE

## 2015-09-16 LAB — GLUCOSE, CAPILLARY
GLUCOSE-CAPILLARY: 142 mg/dL — AB (ref 65–99)
GLUCOSE-CAPILLARY: 141 mg/dL — AB (ref 65–99)
Glucose-Capillary: 225 mg/dL — ABNORMAL HIGH (ref 65–99)
Glucose-Capillary: 115 mg/dL — ABNORMAL HIGH (ref 65–99)

## 2015-09-16 LAB — SURGICAL PCR SCREEN
MRSA, PCR: NEGATIVE
Staphylococcus aureus: NEGATIVE

## 2015-09-16 NOTE — Consult Note (Signed)
Reason for Consult:R groin abscess Referring Physician: Dr Concha Pyo is an 50 y.o. male.  HPI: presents to the ED with worsening groin pain.  He was seen in urgent care ~4 days ago and US revealed unorganized fluid collection only.  He states that it continued to worsen despite PO abx.  No fevers.  No h/o DM.    Past Medical History  Diagnosis Date  . Medical history non-contributory     Past Surgical History  Procedure Laterality Date  . No past surgeries      Family History  Problem Relation Age of Onset  . Diabetes    . Hypertension      Social History:  reports that he has never smoked. He has never used smokeless tobacco. He reports that he drinks alcohol. He reports that he does not use illicit drugs.  Allergies: No Known Allergies  Medications: I have reviewed the patient's current medications.  Results for orders placed or performed during the hospital encounter of 09/14/15 (from the past 48 hour(s))  MRSA PCR Screening     Status: None   Collection Time: 09/14/15  5:20 PM  Result Value Ref Range   MRSA by PCR NEGATIVE NEGATIVE    Comment:        The GeneXpert MRSA Assay (FDA approved for NASAL specimens only), is one component of a comprehensive MRSA colonization surveillance program. It is not intended to diagnose MRSA infection nor to guide or monitor treatment for MRSA infections.   CBC     Status: Abnormal   Collection Time: 09/15/15  3:55 AM  Result Value Ref Range   WBC 10.4 4.0 - 10.5 K/uL   RBC 4.12 (L) 4.22 - 5.81 MIL/uL   Hemoglobin 11.3 (L) 13.0 - 17.0 g/dL   HCT 36.3 (L) 39.0 - 52.0 %   MCV 88.1 78.0 - 100.0 fL   MCH 27.4 26.0 - 34.0 pg   MCHC 31.1 30.0 - 36.0 g/dL   RDW 12.1 11.5 - 15.5 %   Platelets 330 150 - 400 K/uL  Basic metabolic panel     Status: Abnormal   Collection Time: 09/15/15  3:55 AM  Result Value Ref Range   Sodium 138 135 - 145 mmol/L   Potassium 3.6 3.5 - 5.1 mmol/L   Chloride 109 101 - 111 mmol/L   CO2 24 22 - 32 mmol/L   Glucose, Bld 135 (H) 65 - 99 mg/dL   BUN 9 6 - 20 mg/dL   Creatinine, Ser 1.13 0.61 - 1.24 mg/dL   Calcium 8.4 (L) 8.9 - 10.3 mg/dL   GFR calc non Af Amer >60 >60 mL/min   GFR calc Af Amer >60 >60 mL/min    Comment: (NOTE) The eGFR has been calculated using the CKD EPI equation. This calculation has not been validated in all clinical situations. eGFR's persistently <60 mL/min signify possible Chronic Kidney Disease.    Anion gap 5 5 - 15  Glucose, capillary     Status: Abnormal   Collection Time: 09/15/15  9:09 AM  Result Value Ref Range   Glucose-Capillary 221 (H) 65 - 99 mg/dL   Comment 1 Notify RN   Glucose, capillary     Status: Abnormal   Collection Time: 09/15/15  4:34 PM  Result Value Ref Range   Glucose-Capillary 172 (H) 65 - 99 mg/dL   Comment 1 Notify RN   Glucose, capillary     Status: Abnormal   Collection Time: 09/15/15  9:05 PM  Result Value Ref Range   Glucose-Capillary 124 (H) 65 - 99 mg/dL  CBC     Status: Abnormal   Collection Time: 09/16/15  5:25 AM  Result Value Ref Range   WBC 11.2 (H) 4.0 - 10.5 K/uL   RBC 4.33 4.22 - 5.81 MIL/uL   Hemoglobin 12.2 (L) 13.0 - 17.0 g/dL   HCT 38.7 (L) 39.0 - 52.0 %   MCV 89.4 78.0 - 100.0 fL   MCH 28.2 26.0 - 34.0 pg   MCHC 31.5 30.0 - 36.0 g/dL   RDW 12.1 11.5 - 15.5 %   Platelets 343 150 - 400 K/uL  Basic metabolic panel     Status: Abnormal   Collection Time: 09/16/15  5:25 AM  Result Value Ref Range   Sodium 137 135 - 145 mmol/L   Potassium 3.6 3.5 - 5.1 mmol/L   Chloride 106 101 - 111 mmol/L   CO2 26 22 - 32 mmol/L   Glucose, Bld 134 (H) 65 - 99 mg/dL   BUN 10 6 - 20 mg/dL   Creatinine, Ser 1.30 (H) 0.61 - 1.24 mg/dL   Calcium 8.9 8.9 - 10.3 mg/dL   GFR calc non Af Amer >60 >60 mL/min   GFR calc Af Amer >60 >60 mL/min    Comment: (NOTE) The eGFR has been calculated using the CKD EPI equation. This calculation has not been validated in all clinical situations. eGFR's persistently  <60 mL/min signify possible Chronic Kidney Disease.    Anion gap 5 5 - 15  Glucose, capillary     Status: Abnormal   Collection Time: 09/16/15  8:00 AM  Result Value Ref Range   Glucose-Capillary 142 (H) 65 - 99 mg/dL    No results found.  Review of Systems  Constitutional: Negative for fever and chills.  Eyes: Negative for blurred vision.  Respiratory: Negative for cough and shortness of breath.   Cardiovascular: Negative for chest pain.  Gastrointestinal: Negative for nausea, vomiting and abdominal pain.  Genitourinary: Negative for dysuria, urgency and frequency.  Neurological: Negative for dizziness and headaches.   Blood pressure 131/80, pulse 64, temperature 98.1 F (36.7 C), temperature source Oral, resp. rate 17, height _0  (1.778 m), weight 88.2 kg (194 lb 7.1 oz), SpO2 100 %. Physical Exam  Constitutional: He is oriented to person, place, and time. He appears well-developed and well-nourished.  HENT:  Head: Normocephalic and atraumatic.  Eyes: Conjunctivae and EOM are normal. Pupils are equal, round, and reactive to light.  Neck: Normal range of motion. Neck supple.  Cardiovascular: Normal rate and regular rhythm.   Respiratory: Effort normal and breath sounds normal.  GI: Soft. Bowel sounds are normal. He exhibits no distension. There is no tenderness.  Musculoskeletal: Normal range of motion. He exhibits tenderness.  Neurological: He is alert and oriented to person, place, and time.  Skin: Skin is warm. There is erythema.  ~4-5 cm induration in left groin with 1-2 cm area of fluctuance  Assessment/Plan: Will need drainage.  Pt prefers to do this in OR.  He ate breakfast at 8:30am.  Will plan on getting this done later this evening or tom am.  Marcello Moores, Francella Barnett C. 3/55/9741, 63:84 AM

## 2015-09-16 NOTE — Progress Notes (Signed)
PROGRESS NOTE   Miguel Rice  ZOX:[id number]  DOB: [birth year]  DOA: 09/14/2015 PCP: No PCP Per Patient  Hospital course: Miguel Rice is a 50 y.o. male with no significant past medical history. That being said patient states he only goes to the doctor about every 2 years due to requirements for work. Occasionally has high blood pressure during these checks but has never been formally diagnosed with high blood pressure or any other medical condition. Patient presenting with 4 day history of left leg swelling, redness, and pain. Symptoms started on 09/10/2015 with a slight hypersensitivity to the left proximal medial thigh. This rapidly progressed to redness and tenderness and swelling. Patient seen on 09/11/2015 at the [facility] urgent care where he was given a 1 mg dose of Rocephin IM and started on Keflex and Bactrim. Patient states he's been compliant with these medications and while they have helped somewhat with the swelling, the redness and central area of induration has gotten worse. Patient's initial area of erythema/cellulitis was marked with a skin marking pen and the redness has traveled several inches outside of this. At the time of presentation to the urgent care patient was febrile to 101.7. Patient states she's never felt febrile and has also never checked his temperature before that or since. Patient takes Norco for the pain which works fairly well. Patient denies any chest pain, shortness of breath, palpitations, other rashes, tick bites, dysuria, penile discharge, penile lesions, groin adenopathy.  Additionally patient has tried soaking his leg and Epsom salt which he thinks has helped with the swelling.   Assessment & Plan: 1. cellulitis left tigh and leg with possible abscess- Pt is having some improvement but still having pain and large indurated area very painful, [place] neg for abscess but hard indurated mass present, will ask general surgery to take a look at it to see if it  needs I&D, continue IV vancomycin.  2. Hypertension, suboptimally controlled - but improving after adding amlodipine, hct and lisinopril.  Following. 3. Hyperglycemia - suspect diabetes, checking A1c still pending at this time.  Antimicrobials:  clindamycin  Subjective: Pt having improvement in cellulitis but still having pain in large indurated area near groin  Objective: Filed Vitals:   09/15/15 0618 09/15/15 1445 09/15/15 2159 09/16/15 0459  BP: 155/78 167/106 152/93 131/80  Pulse: 88 78 65 64  Temp: 98.2 F (36.8 C) 98.7 F (37.1 C) 98.1 F (36.7 C) 98.1 F (36.7 C)  TempSrc: Oral Oral Oral Oral  Resp: 19  18 17   Height:      Weight:      SpO2: 100% 100% 99% 100%    Intake/Output Summary (Last 24 hours) at 09/16/15 0830 Last data filed at 09/16/15 0502  Gross per 24 hour  Intake    600 ml  Output      0 ml  Net    600 ml   Filed Weights   09/14/15 1138  Weight: 194 lb 7.1 oz (88.2 kg)   Exam:  General: Appears calm and comfortable  Eyes: PERRL, EOMI, normal lids, iris  ENT: grossly normal hearing, lips & tongue, mmm  Neck: no LAD, masses or thyromegaly  Cardiovascular: RRR, no m/r/g. No LE edema.   Respiratory: CTA bilaterally, no w/r/r. Normal respiratory effort.  Abdomen: soft, ntnd, NABS  Skin: Left medial and posterior proximal thigh erythema diminished, persistent induration and tenderness. Most proximally and medially patient with baseball size area of induration.   Musculoskeletal: grossly normal tone  BUE/BLE, good ROM, no bony abnormality  Psychiatric: grossly normal mood and affect, speech fluent and appropriate, AOx3  Neurologic: CN 2-12 grossly intact, moves all extremities in coordinated fashion, sensation intact  Data Reviewed: Basic Metabolic Panel:  Recent Labs Lab 09/14/15 0648 09/15/15 0355 09/16/15 0525  NA 139 138 137  K 3.6 3.6 3.6  CL 105 109 106  CO2 25 24 26   GLUCOSE 144* 135* 134*  BUN 11 9 10    CREATININE 1.23 1.13 1.30*  CALCIUM 8.6* 8.4* 8.9   Liver Function Tests: No results for input(s): AST, ALT, ALKPHOS, BILITOT, PROT, ALBUMIN in the last 168 hours. No results for input(s): LIPASE, AMYLASE in the last 168 hours. No results for input(s): AMMONIA in the last 168 hours. CBC:  Recent Labs Lab 09/14/15 0648 09/15/15 0355 09/16/15 0525  WBC 13.9* 10.4 11.2*  NEUTROABS 9.3*  --   --   HGB 12.1* 11.3* 12.2*  HCT 38.1* 36.3* 38.7*  MCV 87.8 88.1 89.4  PLT 321 330 343   Cardiac Enzymes: No results for input(s): CKTOTAL, CKMB, CKMBINDEX, TROPONINI in the last 168 hours. BNP (last 3 results) No results for input(s): PROBNP in the last 8760 hours. CBG:  Recent Labs Lab 09/15/15 0909 09/15/15 1634 09/15/15 2105 09/16/15 0800  GLUCAP 221* 172* 124* 142*    Recent Results (from the past 240 hour(s))  Culture, blood (routine x 2)     Status: None (Preliminary result)   Collection Time: 09/14/15  9:36 AM  Result Value Ref Range Status   Specimen Description BLOOD LEFT ANTECUBITAL  Final   Special Requests   Final    BOTTLES DRAWN AEROBIC AND ANAEROBIC  10CC AER 5CC ANA   Culture NO GROWTH 1 DAY  Final   Report Status PENDING  Incomplete  Culture, blood (routine x 2)     Status: None (Preliminary result)   Collection Time: 09/14/15  9:44 AM  Result Value Ref Range Status   Specimen Description BLOOD LEFT HAND  Final   Special Requests BOTTLES DRAWN AEROBIC ONLY  10CC  Final   Culture NO GROWTH 1 DAY  Final   Report Status PENDING  Incomplete  MRSA PCR Screening     Status: None   Collection Time: 09/14/15  5:20 PM  Result Value Ref Range Status   MRSA by PCR NEGATIVE NEGATIVE Final    Comment:        The GeneXpert MRSA Assay (FDA approved for NASAL specimens only), is one component of a comprehensive MRSA colonization surveillance program. It is not intended to diagnose MRSA infection nor to guide or monitor treatment for MRSA infections.      Studies: Kore[place] Extrem Low Left Ltd  09/14/2015  CLINICAL DATA:  Left proximal medial thigh pain and swelling for 5 days. EXAM: ULTRASOUND left LOWER EXTREMITY LIMITED TECHNIQUE: Ultrasound examination of the lower extremity soft tissues was performed in the area of clinical concern. COMPARISON:  None. FINDINGS: There is edema/ inflammation in the subcutaneous fat along with streaky areas of probable fluid. No discrete drainable fluid collection to suggest an abscess. IMPRESSION: Diffuse edema/ inflammation/fluid in the subcutaneous fat without discrete drainable fluid collection/abscess. Electronically Signed   By: Rudie MeyerP.  Gallerani M.D.   On: 09/14/2015 10:33   Scheduled Meds: . acidophilus  2 capsule Oral Daily  . amLODipine  5 mg Oral Daily  . enoxaparin (LOVENOX) injection  40 mg Subcutaneous Q24H  . hydrochlorothiazide  12.5 mg Oral Daily  . insulin aspart  0-15 Units Subcutaneous TID WC  . insulin aspart  0-5 Units Subcutaneous QHS  . lisinopril  10 mg Oral Daily  . vancomycin  1,000 mg Intravenous Q12H   Continuous Infusions:   Active Problems:   Cellulitis   Hypertension   Hyperglycemia   Leukocytosis   CKD (chronic kidney disease), stage II  Time spent:   Standley Dakins, MD, FAAFP [facility] Pager [phone]  If 7PM-7AM, please contact night-coverage [email or web address] Password [email or web address] 09/16/2015, 8:30 AM

## 2015-09-17 ENCOUNTER — Encounter (HOSPITAL_COMMUNITY)
Admission: EM | Disposition: A | Discharge status: Home Health | Payer: Self-pay | Source: Home / Self Care | Attending: Family Medicine

## 2015-09-17 ENCOUNTER — Inpatient Hospital Stay (HOSPITAL_COMMUNITY): Payer: BLUE CROSS/BLUE SHIELD | Admitting: Anesthesiology

## 2015-09-17 ENCOUNTER — Encounter (HOSPITAL_COMMUNITY): Payer: Self-pay | Admitting: Anesthesiology

## 2015-09-17 HISTORY — PX: IRRIGATION AND DEBRIDEMENT ABSCESS: SHX5252

## 2015-09-17 LAB — GLUCOSE, CAPILLARY
GLUCOSE-CAPILLARY: 171 mg/dL — AB (ref 65–99)
GLUCOSE-CAPILLARY: 133 mg/dL — AB (ref 65–99)
Glucose-Capillary: 113 mg/dL — ABNORMAL HIGH (ref 65–99)
Glucose-Capillary: 183 mg/dL — ABNORMAL HIGH (ref 65–99)
Glucose-Capillary: 186 mg/dL — ABNORMAL HIGH (ref 65–99)

## 2015-09-17 LAB — HEMOGLOBIN A1C
HEMOGLOBIN A1C: 8 % — AB (ref 4.8–5.6)
Mean Plasma Glucose: 183 mg/dL

## 2015-09-17 SURGERY — IRRIGATION AND DEBRIDEMENT ABSCESS
Anesthesia: General | Site: Groin | Laterality: Left

## 2015-09-17 MED ORDER — HYDROMORPHONE HCL 1 MG/ML IJ SOLN
0.2500 mg | INTRAMUSCULAR | Status: DC | PRN
  Administered 2015-09-17: 0.5 mg via INTRAVENOUS

## 2015-09-17 MED ORDER — ONDANSETRON HCL 4 MG/2ML IJ SOLN: 4.0000 mg | Freq: Once | INTRAMUSCULAR | Status: DC | PRN

## 2015-09-17 MED ORDER — PROPOFOL 10 MG/ML IV BOLUS
INTRAVENOUS | Status: DC | PRN
  Administered 2015-09-17: 150 mg via INTRAVENOUS
  Administered 2015-09-17: 50 mg via INTRAVENOUS

## 2015-09-17 MED ORDER — KETOROLAC TROMETHAMINE 30 MG/ML IJ SOLN
INTRAMUSCULAR | Status: AC
  Administered 2015-09-17: 30 mg via INTRAVENOUS
  Filled 2015-09-17: qty 1

## 2015-09-17 MED ORDER — FENTANYL CITRATE (PF) 250 MCG/5ML IJ SOLN
INTRAMUSCULAR | Status: AC
  Filled 2015-09-17: qty 5

## 2015-09-17 MED ORDER — LIDOCAINE 2% (20 MG/ML) 5 ML SYRINGE
INTRAMUSCULAR | Status: AC
  Filled 2015-09-17: qty 10

## 2015-09-17 MED ORDER — LIDOCAINE HCL (CARDIAC) 20 MG/ML IV SOLN
INTRAVENOUS | Status: DC | PRN
  Administered 2015-09-17: 30 mg via INTRAVENOUS

## 2015-09-17 MED ORDER — ONDANSETRON HCL 4 MG/2ML IJ SOLN
INTRAMUSCULAR | Status: DC | PRN
  Administered 2015-09-17: 4 mg via INTRAVENOUS

## 2015-09-17 MED ORDER — HYDROMORPHONE HCL 1 MG/ML IJ SOLN
INTRAMUSCULAR | Status: AC
  Administered 2015-09-17: 0.5 mg via INTRAVENOUS
  Filled 2015-09-17: qty 1

## 2015-09-17 MED ORDER — OXYCODONE-ACETAMINOPHEN 5-325 MG PO TABS
1.0000 | ORAL_TABLET | ORAL | Status: DC | PRN
  Administered 2015-09-17 – 2015-09-19 (×4): 2 via ORAL
  Filled 2015-09-17 (×5): qty 2

## 2015-09-17 MED ORDER — SODIUM CHLORIDE 0.9 % IV SOLN
INTRAVENOUS | Status: DC
  Administered 2015-09-17 – 2015-09-18 (×4): via INTRAVENOUS

## 2015-09-17 MED ORDER — KETOROLAC TROMETHAMINE 30 MG/ML IJ SOLN
30.0000 mg | Freq: Once | INTRAMUSCULAR | Status: AC
  Administered 2015-09-17: 30 mg via INTRAVENOUS

## 2015-09-17 MED ORDER — LACTATED RINGERS IV SOLN
INTRAVENOUS | Status: DC | PRN
  Administered 2015-09-17 (×2): via INTRAVENOUS

## 2015-09-17 MED ORDER — MIDAZOLAM HCL 2 MG/2ML IJ SOLN
INTRAMUSCULAR | Status: AC
  Filled 2015-09-17: qty 2

## 2015-09-17 MED ORDER — FENTANYL CITRATE (PF) 100 MCG/2ML IJ SOLN
INTRAMUSCULAR | Status: DC | PRN
  Administered 2015-09-17 (×3): 50 ug via INTRAVENOUS

## 2015-09-17 MED ORDER — MIDAZOLAM HCL 5 MG/5ML IJ SOLN
INTRAMUSCULAR | Status: DC | PRN
  Administered 2015-09-17: 2 mg via INTRAVENOUS

## 2015-09-17 MED ORDER — LACTATED RINGERS IV SOLN: INTRAVENOUS | Status: DC

## 2015-09-17 MED ORDER — ROCURONIUM BROMIDE 50 MG/5ML IV SOLN
INTRAVENOUS | Status: AC
  Filled 2015-09-17: qty 2

## 2015-09-17 MED ORDER — DEXAMETHASONE SODIUM PHOSPHATE 10 MG/ML IJ SOLN
INTRAMUSCULAR | Status: AC
  Filled 2015-09-17: qty 1

## 2015-09-17 MED ORDER — 0.9 % SODIUM CHLORIDE (POUR BTL) OPTIME
TOPICAL | Status: DC | PRN
  Administered 2015-09-17: 1000 mL

## 2015-09-17 MED ORDER — PROPOFOL 10 MG/ML IV BOLUS
INTRAVENOUS | Status: AC
  Filled 2015-09-17: qty 20

## 2015-09-17 MED ORDER — EPHEDRINE 5 MG/ML INJ
INTRAVENOUS | Status: AC
  Filled 2015-09-17: qty 10

## 2015-09-17 MED ORDER — EPHEDRINE SULFATE 50 MG/ML IJ SOLN
INTRAMUSCULAR | Status: DC | PRN
  Administered 2015-09-17: 10 mg via INTRAVENOUS

## 2015-09-17 MED ORDER — MEPERIDINE HCL 25 MG/ML IJ SOLN: 6.2500 mg | INTRAMUSCULAR | Status: DC | PRN

## 2015-09-17 MED ORDER — SUGAMMADEX SODIUM 200 MG/2ML IV SOLN
INTRAVENOUS | Status: AC
  Filled 2015-09-17: qty 4

## 2015-09-17 MED ORDER — ONDANSETRON HCL 4 MG/2ML IJ SOLN
INTRAMUSCULAR | Status: AC
  Filled 2015-09-17: qty 4

## 2015-09-17 SURGICAL SUPPLY — 31 items
BNDG GAUZE ELAST 4 BULKY (GAUZE/BANDAGES/DRESSINGS) IMPLANT
CANISTER SUCTION 2500CC (MISCELLANEOUS) ×3 IMPLANT
COVER SURGICAL LIGHT HANDLE (MISCELLANEOUS) ×3 IMPLANT
DRAPE LAPAROSCOPIC ABDOMINAL (DRAPES) IMPLANT
DRAPE LAPAROTOMY T 98X78 PEDS (DRAPES) IMPLANT
DRAPE UTILITY XL STRL (DRAPES) ×6 IMPLANT
DRSG PAD ABDOMINAL 8X10 ST (GAUZE/BANDAGES/DRESSINGS) ×3 IMPLANT
ELECT CAUTERY BLADE 6.4 (BLADE) ×3 IMPLANT
ELECT REM PT RETURN 9FT ADLT (ELECTROSURGICAL) ×3
ELECTRODE REM PT RTRN 9FT ADLT (ELECTROSURGICAL) ×1 IMPLANT
GAUZE IODOFORM PACK 1/2 7832 (GAUZE/BANDAGES/DRESSINGS) ×3 IMPLANT
GAUZE SPONGE 4X4 12PLY STRL (GAUZE/BANDAGES/DRESSINGS) ×3 IMPLANT
GLOVE BIO SURGEON STRL SZ7 (GLOVE) ×3 IMPLANT
GLOVE BIOGEL PI IND STRL 7.0 (GLOVE) ×1 IMPLANT
GLOVE BIOGEL PI IND STRL 8 (GLOVE) ×1 IMPLANT
GLOVE BIOGEL PI INDICATOR 7.0 (GLOVE) ×2
GLOVE BIOGEL PI INDICATOR 8 (GLOVE) ×2
GLOVE ECLIPSE 8.0 STRL XLNG CF (GLOVE) ×3 IMPLANT
GOWN STRL REUS W/ TWL LRG LVL3 (GOWN DISPOSABLE) ×2 IMPLANT
GOWN STRL REUS W/TWL LRG LVL3 (GOWN DISPOSABLE) ×4
KIT BASIN OR (CUSTOM PROCEDURE TRAY) ×3 IMPLANT
KIT ROOM TURNOVER OR (KITS) ×3 IMPLANT
NS IRRIG 1000ML POUR BTL (IV SOLUTION) ×3 IMPLANT
PACK GENERAL/GYN (CUSTOM PROCEDURE TRAY) ×3 IMPLANT
PAD ARMBOARD 7.5X6 YLW CONV (MISCELLANEOUS) ×3 IMPLANT
SWAB COLLECTION DEVICE MRSA (MISCELLANEOUS) IMPLANT
TAPE CLOTH SURG 6X10 WHT LF (GAUZE/BANDAGES/DRESSINGS) ×3 IMPLANT
THERMADRAPE LEGGINGS (DRAPES) ×3 IMPLANT
TOWEL OR 17X24 6PK STRL BLUE (TOWEL DISPOSABLE) ×3 IMPLANT
TOWEL OR 17X26 10 PK STRL BLUE (TOWEL DISPOSABLE) ×3 IMPLANT
TUBE ANAEROBIC SPECIMEN COL (MISCELLANEOUS) IMPLANT

## 2015-09-17 NOTE — Op Note (Signed)
OPERATIVE REPORT  DATE OF OPERATION:  09/17/2015  PATIENT:  Jonna ClarkWayne J Schuenemann  50 y.o. male  PRE-OPERATIVE DIAGNOSIS:  left groin abscess  POST-OPERATIVE DIAGNOSIS:  left posterior medial thigh abscess  FINDINGS:  Large abscess pocket with mucosanguinous purulent fluid from within  PROCEDURE:  Procedure(s): IRRIGATION AND DEBRIDEMENT ABSCESS LEFT GROIN PACKED WITH AN ENTIRE BOTTLE OF 1" IODOFORM NUGAUZE  SURGEON:  Surgeon(s): Jimmye NormanJames Deyton Ellenbecker, MD  ASSISTANT: None  ANESTHESIA:   general  COMPLICATIONS:  None  EBL: <10 ml  BLOOD ADMINISTERED: none  DRAINS: Packed with an entire bottle of 1" iodoform NuGauze   SPECIMEN:  Source of Specimen:  Microbiology for fluid  COUNTS CORRECT:  YES  PROCEDURE DETAILS: The patient was taken to the operating room and placed on the table in supine position. After an adequate general endotracheal anesthetic was administered he was placed in the lithotomy position in the left inguinal medial posterior thigh area prepped with Betadine in the usual sterile manner.  A proper timeout was performed identifying the patient and the procedure to be performed. A #15 blade was used to make an incision diagonally in the center of the fluctuant area on the posterior medial aspect of the left thigh, just lateral to the posterior aspect of the scrotum. We entered into a pocket of mucopurulent, bloody fluid which is drained with a suction device and cultured with anaerobic and aerobic culture medium.  We irrigated the wound with saline solution and subsequent impacted with an entire bottle of 1 inch iodoform Nu Gauze. A sterile dressing was applied. All needle counts, sponge counts, and instrument counts were correct.  PATIENT DISPOSITION:  PACU - hemodynamically stable.   Infant Doane 6/12/201710:49 AM

## 2015-09-17 NOTE — Progress Notes (Signed)
Patient ID: Miguel Rice, male   DOB: 26-Mar-1966, 50 y.o.   MRN: [id number]     [facility]      [address] [place], [place] [address]    Phone: [phone] FAX: [phone]     Subjective: Denies sob, cp.   Has been NPO.   Objective:  Vital signs:  Filed Vitals:   09/16/15 0459 09/16/15 1300 09/16/15 2013 09/17/15 0549  BP: 131/80 140/91 137/87 139/94  Pulse: 64 78 73 62  Temp: 98.1 F (36.7 C) 98 F (36.7 C) 98.8 F (37.1 C) 98.2 F (36.8 C)  TempSrc: Oral Oral Oral Oral  Resp: 17  19 18   Height:      Weight:      SpO2: 100% 100% 100% 100%    Last BM Date: 09/16/15  Intake/Output   Yesterday:  06/11 0701 - 06/12 0700 In: 240 [P.O.:240] Out: -  This shift:     Physical Exam: General: Pt awake/alert/oriented x4 in no acute distress Skin: left groin induration with central area of fluctuance, no cellulitis.    Problem List:   Active Problems:   Cellulitis   Hypertension   Hyperglycemia   Leukocytosis   CKD (chronic kidney disease), stage II    Results:   Labs: Results for orders placed or performed during the hospital encounter of 09/14/15 (from the past 48 hour(s))  Glucose, capillary     Status: Abnormal   Collection Time: 09/15/15  9:09 AM  Result Value Ref Range   Glucose-Capillary 221 (H) 65 - 99 mg/dL   Comment 1 Notify RN   Glucose, capillary     Status: Abnormal   Collection Time: 09/15/15  4:34 PM  Result Value Ref Range   Glucose-Capillary 172 (H) 65 - 99 mg/dL   Comment 1 Notify RN   Glucose, capillary     Status: Abnormal   Collection Time: 09/15/15  9:05 PM  Result Value Ref Range   Glucose-Capillary 124 (H) 65 - 99 mg/dL  CBC     Status: Abnormal   Collection Time: 09/16/15  5:25 AM  Result Value Ref Range   WBC 11.2 (H) 4.0 - 10.5 K/uL   RBC 4.33 4.22 - 5.81 MIL/uL   Hemoglobin 12.2 (L) 13.0 - 17.0 g/dL   HCT 38.7 (L) 39.0 - 52.0 %   MCV 89.4 78.0 - 100.0 fL   MCH  28.2 26.0 - 34.0 pg   MCHC 31.5 30.0 - 36.0 g/dL   RDW 12.1 11.5 - 15.5 %   Platelets 343 150 - 400 K/uL  Basic metabolic panel     Status: Abnormal   Collection Time: 09/16/15  5:25 AM  Result Value Ref Range   Sodium 137 135 - 145 mmol/L   Potassium 3.6 3.5 - 5.1 mmol/L   Chloride 106 101 - 111 mmol/L   CO2 26 22 - 32 mmol/L   Glucose, Bld 134 (H) 65 - 99 mg/dL   BUN 10 6 - 20 mg/dL   Creatinine, Ser 1.30 (H) 0.61 - 1.24 mg/dL   Calcium 8.9 8.9 - 10.3 mg/dL   GFR calc non Af Amer >60 >60 mL/min   GFR calc Af Amer >60 >60 mL/min    Comment: (NOTE) The eGFR has been calculated using the CKD EPI equation. This calculation has not been validated in all clinical situations. eGFR's persistently <60 mL/min signify possible Chronic Kidney Disease.    Anion gap 5 5 - 15  Glucose, capillary     Status: Abnormal   Collection Time: 09/16/15  8:00 AM  Result Value Ref Range   Glucose-Capillary 142 (H) 65 - 99 mg/dL  Glucose, capillary     Status: Abnormal   Collection Time: 09/16/15 12:00 PM  Result Value Ref Range   Glucose-Capillary 141 (H) 65 - 99 mg/dL  Surgical pcr screen     Status: None   Collection Time: 09/16/15 12:47 PM  Result Value Ref Range   MRSA, PCR NEGATIVE NEGATIVE   Staphylococcus aureus NEGATIVE NEGATIVE    Comment:        The Xpert SA Assay (FDA approved for NASAL specimens in patients over 65 years of age), is one component of a comprehensive surveillance program.  Test performance has been validated by [facility] for patients greater than or equal to 31 year old. It is not intended to diagnose infection nor to guide or monitor treatment. Performed at [facility]   Glucose, capillary     Status: Abnormal   Collection Time: 09/16/15  4:28 PM  Result Value Ref Range   Glucose-Capillary 225 (H) 65 - 99 mg/dL   Comment 1 Notify RN   Glucose, capillary     Status: Abnormal   Collection Time: 09/16/15  9:58 PM  Result Value Ref Range    Glucose-Capillary 115 (H) 65 - 99 mg/dL    Imaging / Studies: No results found.  Medications / Allergies:  Scheduled Meds: . acidophilus  2 capsule Oral Daily  . amLODipine  5 mg Oral Daily  . enoxaparin (LOVENOX) injection  40 mg Subcutaneous Q24H  . hydrochlorothiazide  12.5 mg Oral Daily  . insulin aspart  0-15 Units Subcutaneous TID WC  . insulin aspart  0-5 Units Subcutaneous QHS  . lisinopril  10 mg Oral Daily  . vancomycin  1,000 mg Intravenous Q12H   Continuous Infusions:  PRN Meds:.hydrALAZINE, HYDROcodone-acetaminophen, ondansetron **OR** ondansetron (ZOFRAN) IV  Antibiotics: Anti-infectives    Start     Dose/Rate Route Frequency Ordered Stop   09/16/15 0100  vancomycin (VANCOCIN) IVPB 1000 mg/200 mL premix     1,000 mg 200 mL/hr over 60 Minutes Intravenous Every 12 hours 09/15/15 1233     09/15/15 1230  vancomycin (VANCOCIN) 1,750 mg in sodium chloride 0.9 % 500 mL IVPB     1,750 mg 250 mL/hr over 120 Minutes Intravenous  Once 09/15/15 1224 09/15/15 1541   09/14/15 0930  clindamycin (CLEOCIN) IVPB 600 mg  Status:  Discontinued     600 mg 100 mL/hr over 30 Minutes Intravenous Every 8 hours 09/14/15 0916 09/15/15 1208   09/14/15 0700  ceFAZolin (ANCEF) IVPB 1 g/50 mL premix     1 g 100 mL/hr over 30 Minutes Intravenous  Once 09/14/15 0658 09/14/15 0740        Assessment/Plan Left groin abscess-to OR this AM for I&D.  Surgical risks discussed including infection, bleeding, airway compromise, anesthesia risks.  The patient verbalizes understanding and wishes to proceed. ID-Vanc FEN-NPO, add IVF VTE prophylaxis-SCD, lovenox  Dispo-to OR   Erby Pian, ANP-BC [facility] Pager [phone](7A-4:30P) For consults and floor pages call [phone](7A-4:30P)  09/17/2015 7:59 AM

## 2015-09-17 NOTE — Anesthesia Procedure Notes (Signed)
Procedure Name: LMA Insertion Date/Time: 09/17/2015 10:13 AM Performed by: Carmela RimaMARTINELLI, Koreena Joost F Pre-anesthesia Checklist: Patient being monitored, Suction available, Emergency Drugs available, Patient identified and Timeout performed Patient Re-evaluated:Patient Re-evaluated prior to inductionOxygen Delivery Method: Circle system utilized Preoxygenation: Pre-oxygenation with 100% oxygen Intubation Type: IV induction Ventilation: Mask ventilation without difficulty LMA: LMA inserted LMA Size: 4.0 Tube type: Oral Number of attempts: 1 Placement Confirmation: positive ETCO2,  ETT inserted through vocal cords under direct vision and breath sounds checked- equal and bilateral Tube secured with: Tape Dental Injury: Teeth and Oropharynx as per pre-operative assessment

## 2015-09-17 NOTE — Anesthesia Preprocedure Evaluation (Addendum)
Anesthesia Evaluation  Patient identified by MRN, date of birth, ID band  Reviewed: Allergy & Precautions, H&P , NPO status , Patient's Chart, lab work & pertinent test results  Airway Mallampati: I  TM Distance: >3 FB Neck ROM: full    Dental  (+) Teeth Intact, Missing, Dental Advidsory Given, Loose,  Left lower front tooth loose:   Pulmonary neg pulmonary ROS,    Pulmonary exam normal        Cardiovascular hypertension, On Medications Normal cardiovascular exam     Neuro/Psych negative neurological ROS  negative psych ROS   GI/Hepatic negative GI ROS, Neg liver ROS,   Endo/Other  negative endocrine ROS  Renal/GU      Musculoskeletal   Abdominal Normal abdominal exam  (+)   Peds  Hematology negative hematology ROS (+)   Anesthesia Other Findings   Reproductive/Obstetrics negative OB ROS                            Anesthesia Physical Anesthesia Plan  ASA: II  Anesthesia Plan: General   Post-op Pain Management:    Induction: Intravenous  Airway Management Planned: LMA  Additional Equipment:   Intra-op Plan:   Post-operative Plan:   Informed Consent: I have reviewed the patients History and Physical, chart, labs and discussed the procedure including the risks, benefits and alternatives for the proposed anesthesia with the patient or authorized representative who has indicated his/her understanding and acceptance.     Plan Discussed with: CRNA and Surgeon  Anesthesia Plan Comments:         Anesthesia Quick Evaluation

## 2015-09-17 NOTE — Anesthesia Postprocedure Evaluation (Signed)
Anesthesia Post Note  Patient: Miguel Rice  Procedure(s) Performed: Procedure(s) (LRB): IRRIGATION AND DEBRIDEMENT ABSCESS LEFT GROIN (Left)  Patient location during evaluation: PACU Anesthesia Type: General Level of consciousness: sedated Pain management: pain level controlled Vital Signs Assessment: post-procedure vital signs reviewed and stable Respiratory status: spontaneous breathing Cardiovascular status: stable Postop Assessment: no signs of nausea or vomiting Anesthetic complications: no     Last Vitals:  Filed Vitals:   09/17/15 1100 09/17/15 1115  BP: 136/90 138/98  Pulse: 69 70  Temp: 36.4 C   Resp: 16 16    Last Pain:  Filed Vitals:   09/17/15 1124  PainSc: Asleep   Pain Goal:                 Zannie Locastro JR,JOHN Prathik Aman

## 2015-09-17 NOTE — Transfer of Care (Signed)
Immediate Anesthesia Transfer of Care Note  Patient: Miguel Rice  Procedure(s) Performed: Procedure(s): IRRIGATION AND DEBRIDEMENT ABSCESS LEFT GROIN (Left)  Patient Location: PACU  Anesthesia Type:General  Level of Consciousness: awake, alert  and oriented  Airway & Oxygen Therapy: Patient Spontanous Breathing and Patient connected to face mask oxygen  Post-op Assessment: Report given to RN, Post -op Vital signs reviewed and stable and Patient moving all extremities X 4  Post vital signs: Reviewed and stable  Last Vitals:  Filed Vitals:   09/16/15 2013 09/17/15 0549  BP: 137/87 139/94  Pulse: 73 62  Temp: 37.1 C 36.8 C  Resp: 19 18    Last Pain:  Filed Vitals:   09/17/15 0829  PainSc: 0-No pain         Complications: No apparent anesthesia complications

## 2015-09-17 NOTE — Progress Notes (Signed)
PROGRESS NOTE   Miguel Rice  ZOX:[id number]  DOB: December 24, 1965  DOA: 09/14/2015 PCP: No PCP Per Patient  Hospital course: Miguel ClarkWayne J Prowse is a 50 y.o. male with no significant past medical history. That being said patient states he only goes to the doctor about every 2 years due to requirements for work. Occasionally has high blood pressure during these checks but has never been formally diagnosed with high blood pressure or any other medical condition. Patient presenting with 4 day history of left leg swelling, redness, and pain. Symptoms started on 09/10/2015 with a slight hypersensitivity to the left proximal medial thigh. This rapidly progressed to redness and tenderness and swelling. Patient seen on 09/11/2015 at the [facility] care where he was given a 1 mg dose of Rocephin IM and started on Keflex and Bactrim. Patient states he's been compliant with these medications and while they have helped somewhat with the swelling, the redness and central area of induration has gotten worse. Patient's initial area of erythema/cellulitis was marked with a skin marking pen and the redness has traveled several inches outside of this. At the time of presentation to the urgent care patient was febrile to 101.7. Patient states she's never felt febrile and has also never checked his temperature before that or since. Patient takes Norco for the pain which works fairly well. Patient denies any chest pain, shortness of breath, palpitations, other rashes, tick bites, dysuria, penile discharge, penile lesions, groin adenopathy.  Additionally patient has tried soaking his leg and Epsom salt which he thinks has helped with the swelling.  General surgery consulted 6/11 and pt taken to OR on 6/12 for I&D.    Assessment & Plan: 1. cellulitis left tigh and leg with possible abscess- Pt to OR on 6/12 for I&D.  2. Hypertension, suboptimally controlled - improving after adding amlodipine, hct and lisinopril.   Following. 3. Hyperglycemia - suspect diabetes, checking A1c still pending at this time.  Antimicrobials:  clindamycin  Subjective: Pt having improvement in cellulitis but still having pain in large indurated area near groin  Objective: Filed Vitals:   09/16/15 0459 09/16/15 1300 09/16/15 2013 09/17/15 0549  BP: 131/80 140/91 137/87 139/94  Pulse: 64 78 73 62  Temp: 98.1 F (36.7 C) 98 F (36.7 C) 98.8 F (37.1 C) 98.2 F (36.8 C)  TempSrc: Oral Oral Oral Oral  Resp: 17  19 18   Height:      Weight:      SpO2: 100% 100% 100% 100%    Intake/Output Summary (Last 24 hours) at 09/17/15 0907 Last data filed at 09/16/15 1012  Gross per 24 hour  Intake    240 ml  Output      0 ml  Net    240 ml   Filed Weights   09/14/15 1138  Weight: 194 lb 7.1 oz (88.2 kg)   Exam:  General: Appears calm and comfortable  Eyes: PERRL, EOMI, normal lids, iris  ENT: grossly normal hearing, lips & tongue, mmm  Neck: no LAD, masses or thyromegaly  Cardiovascular: RRR, no m/r/g. No LE edema.   Respiratory: CTA bilaterally, no w/r/r. Normal respiratory effort.  Abdomen: soft, ntnd, NABS  Skin: Left medial and posterior proximal thigh erythema diminished, persistent induration and tenderness. Most proximally and medially patient with baseball size area of induration.   Musculoskeletal: grossly normal tone BUE/BLE, good ROM, no bony abnormality  Psychiatric: grossly normal mood and affect, speech fluent and appropriate, AOx3  Neurologic: CN 2-12  grossly intact, moves all extremities in coordinated fashion, sensation intact  Data Reviewed: Basic Metabolic Panel:  Recent Labs Lab 09/14/15 0648 09/15/15 0355 09/16/15 0525  NA 139 138 137  K 3.6 3.6 3.6  CL 105 109 106  CO2 GLUCOSE 144* 135* 134*  BUN CREATININE 1.23 1.13 1.30*  CALCIUM 8.6* 8.4* 8.9   Liver Function Tests: No results for input(s): AST, ALT, ALKPHOS, BILITOT, PROT, ALBUMIN in  the last 168 hours. No results for input(s): LIPASE, AMYLASE in the last 168 hours. No results for input(s): AMMONIA in the last 168 hours. CBC:  Recent Labs Lab 09/14/15 0648 09/15/15 0355 09/16/15 0525  WBC 13.9* 10.4 11.2*  NEUTROABS 9.3*  --   --   HGB 12.1* 11.3* 12.2*  HCT 38.1* 36.3* 38.7*  MCV 87.8 88.1 89.4  PLT 321 330 343   Cardiac Enzymes: No results for input(s): CKTOTAL, CKMB, CKMBINDEX, TROPONINI in the last 168 hours. BNP (last 3 results) No results for input(s): PROBNP in the last 8760 hours. CBG:  Recent Labs Lab 09/16/15 0800 09/16/15 1200 09/16/15 1628 09/16/15 2158 09/17/15 0815  GLUCAP 142* 141* 225* 115* 171*    Recent Results (from the past 240 hour(s))  Culture, blood (routine x 2)     Status: None (Preliminary result)   Collection Time: 09/14/15  9:36 AM  Result Value Ref Range Status   Specimen Description BLOOD LEFT ANTECUBITAL  Final   Special Requests   Final    BOTTLES DRAWN AEROBIC AND ANAEROBIC  10CC AER 5CC ANA   Culture NO GROWTH 2 DAYS  Final   Report Status PENDING  Incomplete  Culture, blood (routine x 2)     Status: None (Preliminary result)   Collection Time: 09/14/15  9:44 AM  Result Value Ref Range Status   Specimen Description BLOOD LEFT HAND  Final   Special Requests BOTTLES DRAWN AEROBIC ONLY  10CC  Final   Culture NO GROWTH 2 DAYS  Final   Report Status PENDING  Incomplete  MRSA PCR Screening     Status: None   Collection Time: 09/14/15  5:20 PM  Result Value Ref Range Status   MRSA by PCR NEGATIVE NEGATIVE Final    Comment:        The GeneXpert MRSA Assay (FDA approved for NASAL specimens only), is one component of a comprehensive MRSA colonization surveillance program. It is not intended to diagnose MRSA infection nor to guide or monitor treatment for MRSA infections.   Surgical pcr screen     Status: None   Collection Time: 09/16/15 12:47 PM  Result Value Ref Range Status   MRSA, PCR NEGATIVE NEGATIVE  Final   Staphylococcus aureus NEGATIVE NEGATIVE Final    Comment:        The Xpert SA Assay (FDA approved for NASAL specimens in patients over 84 years of age), is one component of a comprehensive surveillance program.  Test performance has been validated by [facility] for patients greater than or equal to 68 year old. It is not intended to diagnose infection nor to guide or monitor treatment. Performed at [facility]     Studies: No results found. Scheduled Meds: . acidophilus  2 capsule Oral Daily  . amLODipine  5 mg Oral Daily  . enoxaparin (LOVENOX) injection  40 mg Subcutaneous Q24H  . hydrochlorothiazide  12.5 mg Oral Daily  . insulin aspart  0-15 Units Subcutaneous TID WC  .  insulin aspart  0-5 Units Subcutaneous QHS  . lisinopril  10 mg Oral Daily  . vancomycin  1,000 mg Intravenous Q12H   Continuous Infusions: . sodium chloride 100 mL/hr at 09/17/15 1610   Active Problems:   Cellulitis   Hypertension   Hyperglycemia   Leukocytosis   CKD (chronic kidney disease), stage II  Time spent:   Standley Dakins, MD, FAAFP [facility] Pager [phone]  If 7PM-7AM, please contact night-coverage [email or web address] Password [email or web address] 09/17/2015, 9:07 AM    LOS: 1 day

## 2015-09-18 ENCOUNTER — Encounter (HOSPITAL_COMMUNITY): Payer: Self-pay | Admitting: General Surgery

## 2015-09-18 DIAGNOSIS — E119 Type 2 diabetes mellitus without complications: Secondary | ICD-10-CM

## 2015-09-18 LAB — CBC
HEMATOCRIT: 37 % — AB (ref 39.0–52.0)
HEMOGLOBIN: 11.5 g/dL — AB (ref 13.0–17.0)
MCH: 27.7 pg (ref 26.0–34.0)
MCHC: 31.1 g/dL (ref 30.0–36.0)
MCV: 89.2 fL (ref 78.0–100.0)
Platelets: 335 10*3/uL (ref 150–400)
RBC: 4.15 MIL/uL — AB (ref 4.22–5.81)
RDW: 12.2 % (ref 11.5–15.5)
WBC: 11.6 10*3/uL — AB (ref 4.0–10.5)

## 2015-09-18 LAB — BASIC METABOLIC PANEL
ANION GAP: 7 (ref 5–15)
BUN: 12 mg/dL (ref 6–20)
CHLORIDE: 110 mmol/L (ref 101–111)
CO2: 21 mmol/L — ABNORMAL LOW (ref 22–32)
Calcium: 8.4 mg/dL — ABNORMAL LOW (ref 8.9–10.3)
Creatinine, Ser: 1.13 mg/dL (ref 0.61–1.24)
GFR calc Af Amer: >60 mL/min (ref >60)
GFR calc non Af Amer: >60 mL/min (ref >60)
Glucose, Bld: 132 mg/dL — ABNORMAL HIGH (ref 65–99)
POTASSIUM: 4.3 mmol/L (ref 3.5–5.1)
SODIUM: 138 mmol/L (ref 135–145)

## 2015-09-18 LAB — GLUCOSE, CAPILLARY
Glucose-Capillary: 158 mg/dL — ABNORMAL HIGH (ref 65–99)
Glucose-Capillary: 180 mg/dL — ABNORMAL HIGH (ref 65–99)
Glucose-Capillary: 129 mg/dL — ABNORMAL HIGH (ref 65–99)
Glucose-Capillary: 125 mg/dL — ABNORMAL HIGH (ref 65–99)

## 2015-09-18 MED ORDER — SULFAMETHOXAZOLE-TRIMETHOPRIM 800-160 MG PO TABS
1.0000 | ORAL_TABLET | Freq: Two times a day (BID) | ORAL | Status: DC
  Administered 2015-09-18 – 2015-09-19 (×3): 1 via ORAL
  Filled 2015-09-18 (×3): qty 1

## 2015-09-18 MED ORDER — LIVING WELL WITH DIABETES BOOK
Freq: Once | Status: AC
  Administered 2015-09-18: 17:00:00
  Filled 2015-09-18: qty 1

## 2015-09-18 NOTE — Progress Notes (Signed)
Inpatient Diabetes Program Recommendations  AACE/ADA: New Consensus Statement on Inpatient Glycemic Control (2015)  Target Ranges:  Prepandial:   less than 140 mg/dL      Peak postprandial:   less than 180 mg/dL (1-2 hours)      Critically ill patients:  140 - 180 mg/dL   Lab Results  Component Value Date   GLUCAP 158* 09/18/2015   HGBA1C 8.0* 09/15/2015    Review of Glycemic Control  Diabetes history: New diagnosis Current orders for Inpatient glycemic control: Novolog Moderate TID + HS scale  Inpatient Diabetes Program Recommendations: HgbA1C: 8%. Meeting criteria for new diagnosis. Patient may benefit from oral medication at discharge per inpatient Attending Physician recommendations. Outpatient Referral: Patient has insurance and if agreeable would qualify for outpatient DM Education as well. Please place order if desired  Will see patient today to discuss new diagnosis. Ordered the Living Well with Diabetes booklet. RN to teach DM survival skills and have patient watch educational videos while here (orders placed with specific guidelines to help direct RN teaching).  Thanks,  Christena DeemShannon Andru Genter RN, MSN, [facility] Inpatient Diabetes Coordinator Team Pager [phone] (8a-5p)

## 2015-09-18 NOTE — Progress Notes (Signed)
Patient ID: Miguel Rice, male   DOB: 16-Apr-1965, 50 y.o.   MRN: [id number]     [facility]      [address] [place], [place] [address]    Phone: [phone] FAX: [phone]     Subjective: No n/v.  Wbc unchanged. Afebrile. Feels better today.   Objective:  Vital signs:  Filed Vitals:   09/17/15 1148 09/17/15 2009 09/18/15 0126 09/18/15 0436  BP: 143/88 150/86 118/87 138/84  Pulse: 70 68 61 60  Temp: 97.9 F (36.6 C) 98.5 F (36.9 C) 98.2 F (36.8 C) 98.4 F (36.9 C)  TempSrc: Oral Oral Oral Oral  Resp: 16 18 18 16   Height:      Weight:      SpO2: 97% 98% 97% 100%    Last BM Date: 09/16/15  Intake/Output   Yesterday:  06/12 0701 - 06/13 0700 In: 4132 [P.O.:480; I.V.:3255] Out: 50 [Blood:50] This shift:    I/O last 3 completed shifts: In: 4401 [P.O.:480; I.V.:3255] Out: 65 [Blood:50]    Physical Exam: General: Pt awake/alert/oriented x4 in no acute distress Skin: dressing with old saturated blood.  Packing was not removed.  No surrounding erythema.  Outer dressing replaced.    Problem List:   Active Problems:   Cellulitis   Hypertension   Hyperglycemia   Leukocytosis   CKD (chronic kidney disease), stage II    Results:   Labs: Results for orders placed or performed during the hospital encounter of 09/14/15 (from the past 48 hour(s))  Glucose, capillary     Status: Abnormal   Collection Time: 09/16/15  8:00 AM  Result Value Ref Range   Glucose-Capillary 142 (H) 65 - 99 mg/dL  Glucose, capillary     Status: Abnormal   Collection Time: 09/16/15 12:00 PM  Result Value Ref Range   Glucose-Capillary 141 (H) 65 - 99 mg/dL  Surgical pcr screen     Status: None   Collection Time: 09/16/15 12:47 PM  Result Value Ref Range   MRSA, PCR NEGATIVE NEGATIVE   Staphylococcus aureus NEGATIVE NEGATIVE    Comment:        The Xpert SA Assay (FDA approved for NASAL specimens in patients over 21 years of  age), is one component of a comprehensive surveillance program.  Test performance has been validated by [facility] for patients greater than or equal to 65 year old. It is not intended to diagnose infection nor to guide or monitor treatment. Performed at [place]   Glucose, capillary     Status: Abnormal   Collection Time: 09/16/15  4:28 PM  Result Value Ref Range   Glucose-Capillary 225 (H) 65 - 99 mg/dL   Comment 1 Notify RN   Glucose, capillary     Status: Abnormal   Collection Time: 09/16/15  9:58 PM  Result Value Ref Range   Glucose-Capillary 115 (H) 65 - 99 mg/dL  Glucose, capillary     Status: Abnormal   Collection Time: 09/17/15  8:15 AM  Result Value Ref Range   Glucose-Capillary 171 (H) 65 - 99 mg/dL   Comment 1 Notify RN   Aerobic/Anaerobic Culture(surg specimen)     Status: None (Preliminary result)   Collection Time: 09/17/15 10:38 AM  Result Value Ref Range   Specimen Description ABSCESS LEFT GROIN    Special Requests SPECIMEN A SWABS PATIENT ON FOLLOWING  VANCOMYCIN    Gram Stain      ABUNDANT WBC  PRESENT,BOTH PMN AND MONONUCLEAR MODERATE GRAM POSITIVE COCCI IN PAIRS IN CLUSTERS    Culture PENDING    Report Status PENDING   Glucose, capillary     Status: Abnormal   Collection Time: 09/17/15 11:03 AM  Result Value Ref Range   Glucose-Capillary 113 (H) 65 - 99 mg/dL   Comment 1 Notify RN   Glucose, capillary     Status: Abnormal   Collection Time: 09/17/15 12:45 PM  Result Value Ref Range   Glucose-Capillary 133 (H) 65 - 99 mg/dL   Comment 1 Notify RN   Glucose, capillary     Status: Abnormal   Collection Time: 09/17/15  5:19 PM  Result Value Ref Range   Glucose-Capillary 183 (H) 65 - 99 mg/dL   Comment 1 Notify RN   Glucose, capillary     Status: Abnormal   Collection Time: 09/17/15  9:50 PM  Result Value Ref Range   Glucose-Capillary 186 (H) 65 - 99 mg/dL  CBC     Status: Abnormal   Collection Time: 09/18/15  4:37 AM   Result Value Ref Range   WBC 11.6 (H) 4.0 - 10.5 K/uL   RBC 4.15 (L) 4.22 - 5.81 MIL/uL   Hemoglobin 11.5 (L) 13.0 - 17.0 g/dL   HCT 37.0 (L) 39.0 - 52.0 %   MCV 89.2 78.0 - 100.0 fL   MCH 27.7 26.0 - 34.0 pg   MCHC 31.1 30.0 - 36.0 g/dL   RDW 12.2 11.5 - 15.5 %   Platelets 335 150 - 400 K/uL  Basic metabolic panel     Status: Abnormal   Collection Time: 09/18/15  4:37 AM  Result Value Ref Range   Sodium 138 135 - 145 mmol/L   Potassium 4.3 3.5 - 5.1 mmol/L   Chloride 110 101 - 111 mmol/L   CO2 21 (L) 22 - 32 mmol/L   Glucose, Bld 132 (H) 65 - 99 mg/dL   BUN 12 6 - 20 mg/dL   Creatinine, Ser 1.13 0.61 - 1.24 mg/dL   Calcium 8.4 (L) 8.9 - 10.3 mg/dL   GFR calc non Af Amer >60 >60 mL/min   GFR calc Af Amer >60 >60 mL/min    Comment: (NOTE) The eGFR has been calculated using the CKD EPI equation. This calculation has not been validated in all clinical situations. eGFR's persistently <60 mL/min signify possible Chronic Kidney Disease.    Anion gap 7 5 - 15    Imaging / Studies: No results found.  Medications / Allergies:  Scheduled Meds: . acidophilus  2 capsule Oral Daily  . amLODipine  5 mg Oral Daily  . enoxaparin (LOVENOX) injection  40 mg Subcutaneous Q24H  . hydrochlorothiazide  12.5 mg Oral Daily  . insulin aspart  0-15 Units Subcutaneous TID WC  . insulin aspart  0-5 Units Subcutaneous QHS  . lisinopril  10 mg Oral Daily  . vancomycin  1,000 mg Intravenous Q12H   Continuous Infusions: . sodium chloride 100 mL/hr at 09/18/15 0601  . lactated ringers     PRN Meds:.hydrALAZINE, HYDROcodone-acetaminophen, ondansetron **OR** ondansetron (ZOFRAN) IV, oxyCODONE-acetaminophen  Antibiotics: Anti-infectives    Start     Dose/Rate Route Frequency Ordered Stop   09/16/15 0100  vancomycin (VANCOCIN) IVPB 1000 mg/200 mL premix     1,000 mg 200 mL/hr over 60 Minutes Intravenous Every 12 hours 09/15/15 1233     09/15/15 1230  vancomycin (VANCOCIN) 1,750 mg in sodium  chloride 0.9 % 500 mL IVPB  1,750 mg 250 mL/hr over 120 Minutes Intravenous  Once 09/15/15 1224 09/15/15 1541   09/14/15 0930  clindamycin (CLEOCIN) IVPB 600 mg  Status:  Discontinued     600 mg 100 mL/hr over 30 Minutes Intravenous Every 8 hours 09/14/15 0916 09/15/15 1208   09/14/15 0700  ceFAZolin (ANCEF) IVPB 1 g/50 mL premix     1 g 100 mL/hr over 30 Minutes Intravenous  Once 09/14/15 0658 09/14/15 0740        Assessment/Plan POD#1 left groin abscess---Dr. Hulen Skains -leave packing in today, plan to change tomorrow and hopefully discharge. -HH for dressing changes, daughter who is a CNA to assist ID-[facility] on culture, Vanc, can transition to PO Newly diagnosed DM-A1c 8.  Per medicine  FEN-no issues VTE prophylaxis-scd, add lovenox Dispo-anticipate DC in AM  Erby Pian, [facility] Pager [phone](7A-4:30P) For consults and floor pages call [phone](7A-4:30P)  09/18/2015 7:54 AM

## 2015-09-18 NOTE — Progress Notes (Signed)
Inpatient Diabetes Program Recommendations  AACE/ADA: New Consensus Statement on Inpatient Glycemic Control (2015)  Target Ranges:  Prepandial:   less than 140 mg/dL      Peak postprandial:   less than 180 mg/dL (1-2 hours)      Critically ill patients:  140 - 180 mg/dL   Lab Results  Component Value Date   GLUCAP 180* 09/18/2015   HGBA1C 8.0* 09/15/2015   Review of Glycemic Control  Diabetes history: Newly Diagnosed this admission  Spoke with patient about new diabetes diagnosis.  Discussed A1C results (8%) and explained what an A1c is. Discussed basic pathophysiology of DM Type 2, basic home care, importance of checking CBGs and maintaining good CBG control to prevent long-term and short-term complications and wound healing. Reviewed glucose and A1C goals and explained that patient will need to continue to   Discussed impact of nutrition, exercise, stress, sickness, and medications on diabetes control.  Discussed carbohydrates, carbohydrate goals per day and meal, along with portion sizes. Patient reports drinking regular sodas. Informed patient that he may be prescribed Metformin at d/c and will need to watch for GI side effects with it. Asked patient to check his glucose 1-2 times per day.  Patient verbalized understanding of information discussed and he states that he has no further questions at this time related to diabetes.   RNs to provide ongoing basic DM education at bedside with this patient and engage patient to actively check blood glucose and administer insulin injections.   RN to direct patient on how to check glucose while inpatient.  MD patient will need at time of discharge: Glucose meter kit (order # [id number])  Thanks, Tama Headings RN, MSN, [facility] Inpatient Diabetes Coordinator Team Pager [phone] (8a-5p)

## 2015-09-18 NOTE — Progress Notes (Signed)
PROGRESS NOTE   [name]  Rice:[id number]  DOB: 07-09-1965  DOA: 09/14/2015 PCP: No PCP Per Patient  Hospital course: Miguel Rice is a 50 y.o. male with no significant past medical history. That being said patient states he only goes to the doctor about every 2 years due to requirements for work. Occasionally has high blood pressure during these checks but has never been formally diagnosed with high blood pressure or any other medical condition. Patient presenting with 4 day history of left leg swelling, redness, and pain. Symptoms started on 09/10/2015 with a slight hypersensitivity to the left proximal medial thigh. This rapidly progressed to redness and tenderness and swelling. Patient seen on 09/11/2015 at the [facility] urgent care where he was given a 1 mg dose of Rocephin IM and started on Keflex and Bactrim. Patient states he's been compliant with these medications and while they have helped somewhat with the swelling, the redness and central area of induration has gotten worse. Patient's initial area of erythema/cellulitis was marked with a skin marking pen and the redness has traveled several inches outside of this. At the time of presentation to the urgent care patient was febrile to 101.7. Patient states she's never felt febrile and has also never checked his temperature before that or since. Patient takes Norco for the pain which works fairly well. Patient denies any chest pain, shortness of breath, palpitations, other rashes, tick bites, dysuria, penile discharge, penile lesions, groin adenopathy.  Additionally patient has tried soaking his leg and Epsom salt which he thinks has helped with the swelling.  General surgery consulted 6/11 and pt taken to OR on 6/12 for I&D.    Assessment & Plan: 1. Cellulitis left thigh with abscess- s/p I&D on 6/12.  Plan to remove packing tomorrow and discharge per surgery.  2. Hypertension, suboptimally controlled - improving after adding  amlodipine, hct and lisinopril.  Following. 3. Hyperglycemia - A1c 8%, consistent with newly diagnosed diabetes mellitus.  He will need to be discharged on oral meds, set up for diabetes education and arranged to follow up with community wellness clinic.   Antimicrobials:  vancomycin  Subjective: Pt feels better after I&D procedure.   Objective: Filed Vitals:   09/18/15 0126 09/18/15 0436 09/18/15 0923 09/18/15 0935  BP: 118/87 138/84 146/94   Pulse: 61 60 58 62  Temp: 98.2 F (36.8 C) 98.4 F (36.9 C) 97.5 F (36.4 C)   TempSrc: Oral Oral Oral   Resp: 18 16 18    Height:      Weight:      SpO2: 97% 100% 100%     Intake/Output Summary (Last 24 hours) at 09/18/15 1138 Last data filed at 09/18/15 9562  Gross per 24 hour  Intake   2975 ml  Output      0 ml  Net   2975 ml   Filed Weights   09/14/15 1138  Weight: 194 lb 7.1 oz (88.2 kg)   Exam:  General: Appears calm and comfortable  Eyes: PERRL, EOMI, normal lids, iris  ENT: grossly normal hearing, lips & tongue, mmm  Neck: no LAD, masses or thyromegaly  Cardiovascular: RRR, no m/r/g. No LE edema.   Respiratory: CTA bilaterally, no w/r/r. Normal respiratory effort.  Abdomen: soft, ntnd, NABS  Skin: wound is packed but not draining at this time  Musculoskeletal: grossly normal tone BUE/BLE, good ROM, no bony abnormality  Psychiatric: grossly normal mood and affect, speech fluent and appropriate, AOx3  Neurologic: CN 2-12  grossly intact, moves all extremities in coordinated fashion, sensation intact  Data Reviewed: Basic Metabolic Panel:  Recent Labs Lab 09/14/15 0648 09/15/15 0355 09/16/15 0525 09/18/15 0437  NA 139 138 137 138  K 3.6 3.6 3.6 4.3  CL 105 109 106 110  CO2 25 24 26  21*  GLUCOSE 144* 135* 134* 132*  BUN 11 9 10 12   CREATININE 1.23 1.13 1.30* 1.13  CALCIUM 8.6* 8.4* 8.9 8.4*   Liver Function Tests: No results for input(s): AST, ALT, ALKPHOS, BILITOT, PROT, ALBUMIN in  the last 168 hours. No results for input(s): LIPASE, AMYLASE in the last 168 hours. No results for input(s): AMMONIA in the last 168 hours. CBC:  Recent Labs Lab 09/14/15 0648 09/15/15 0355 09/16/15 0525 09/18/15 0437  WBC 13.9* 10.4 11.2* 11.6*  NEUTROABS 9.3*  --   --   --   HGB 12.1* 11.3* 12.2* 11.5*  HCT 38.1* 36.3* 38.7* 37.0*  MCV 87.8 88.1 89.4 89.2  PLT 321 330 343 335   Cardiac Enzymes: No results for input(s): CKTOTAL, CKMB, CKMBINDEX, TROPONINI in the last 168 hours. BNP (last 3 results) No results for input(s): PROBNP in the last 8760 hours. CBG:  Recent Labs Lab 09/17/15 1103 09/17/15 1245 09/17/15 1719 09/17/15 2150 09/18/15 0751  GLUCAP 113* 133* 183* 186* 158*    Recent Results (from the past 240 hour(s))  Culture, blood (routine x 2)     Status: None (Preliminary result)   Collection Time: 09/14/15  9:36 AM  Result Value Ref Range Status   Specimen Description BLOOD LEFT ANTECUBITAL  Final   Special Requests   Final    BOTTLES DRAWN AEROBIC AND ANAEROBIC  10CC AER 5CC ANA   Culture NO GROWTH 3 DAYS  Final   Report Status PENDING  Incomplete  Culture, blood (routine x 2)     Status: None (Preliminary result)   Collection Time: 09/14/15  9:44 AM  Result Value Ref Range Status   Specimen Description BLOOD LEFT HAND  Final   Special Requests BOTTLES DRAWN AEROBIC ONLY  10CC  Final   Culture NO GROWTH 3 DAYS  Final   Report Status PENDING  Incomplete  MRSA PCR Screening     Status: None   Collection Time: 09/14/15  5:20 PM  Result Value Ref Range Status   MRSA by PCR NEGATIVE NEGATIVE Final    Comment:        The GeneXpert MRSA Assay (FDA approved for NASAL specimens only), is one component of a comprehensive MRSA colonization surveillance program. It is not intended to diagnose MRSA infection nor to guide or monitor treatment for MRSA infections.   Surgical pcr screen     Status: None   Collection Time: 09/16/15 12:47 PM  Result Value  Ref Range Status   MRSA, PCR NEGATIVE NEGATIVE Final   Staphylococcus aureus NEGATIVE NEGATIVE Final    Comment:        The Xpert SA Assay (FDA approved for NASAL specimens in patients over 52 years of age), is one component of a comprehensive surveillance program.  Test performance has been validated by [facility] for patients greater than or equal to 52 year old. It is not intended to diagnose infection nor to guide or monitor treatment. [facility]   Aerobic/Anaerobic Culture(surg specimen)     Status: None (Preliminary result)   Collection Time: 09/17/15 10:38 AM  Result Value Ref Range Status   Specimen Description ABSCESS LEFT GROIN  Final   Special Requests SPECIMEN A SWABS PATIENT ON FOLLOWING  VANCOMYCIN  Final   Gram Stain   Final    ABUNDANT WBC PRESENT,BOTH PMN AND MONONUCLEAR MODERATE GRAM POSITIVE COCCI IN PAIRS IN CLUSTERS    Culture PENDING  Incomplete   Report Status PENDING  Incomplete    Studies: No results found. Scheduled Meds: . acidophilus  2 capsule Oral Daily  . amLODipine  5 mg Oral Daily  . enoxaparin (LOVENOX) injection  40 mg Subcutaneous Q24H  . hydrochlorothiazide  12.5 mg Oral Daily  . insulin aspart  0-15 Units Subcutaneous TID WC  . insulin aspart  0-5 Units Subcutaneous QHS  . lisinopril  10 mg Oral Daily  . living well with diabetes book   Does not apply Once  . vancomycin  1,000 mg Intravenous Q12H   Continuous Infusions: . sodium chloride 100 mL/hr at 09/18/15 0601  . lactated ringers     Active Problems:   Cellulitis   Hypertension   Hyperglycemia   Leukocytosis   CKD (chronic kidney disease), stage II  Time spent:   Standley Dakins, MD, FAAFP [facility] Pager [phone]  If 7PM-7AM, please contact night-coverage [email or web address] Password [email or web address] 09/18/2015, 11:38 AM    LOS: 2 days

## 2015-09-19 DIAGNOSIS — I10 Essential (primary) hypertension: Secondary | ICD-10-CM

## 2015-09-19 DIAGNOSIS — E11628 Type 2 diabetes mellitus with other skin complications: Secondary | ICD-10-CM

## 2015-09-19 DIAGNOSIS — L03116 Cellulitis of left lower limb: Secondary | ICD-10-CM

## 2015-09-19 LAB — CULTURE, BLOOD (ROUTINE X 2)
CULTURE: NO GROWTH
Culture: NO GROWTH

## 2015-09-19 LAB — GLUCOSE, CAPILLARY
GLUCOSE-CAPILLARY: 128 mg/dL — AB (ref 65–99)
GLUCOSE-CAPILLARY: 149 mg/dL — AB (ref 65–99)

## 2015-09-19 MED ORDER — AMLODIPINE BESYLATE 10 MG PO TABS: 10.0000 mg | ORAL_TABLET | Freq: Every day | ORAL | Status: DC

## 2015-09-19 MED ORDER — HYDROCHLOROTHIAZIDE 12.5 MG PO CAPS: 12.5000 mg | ORAL_CAPSULE | Freq: Every day | ORAL | Status: DC

## 2015-09-19 MED ORDER — SULFAMETHOXAZOLE-TRIMETHOPRIM 800-160 MG PO TABS: 1.0000 | ORAL_TABLET | Freq: Two times a day (BID) | ORAL | Status: DC

## 2015-09-19 MED ORDER — LISINOPRIL 10 MG PO TABS: 10.0000 mg | ORAL_TABLET | Freq: Every day | ORAL | Status: DC

## 2015-09-19 MED ORDER — METFORMIN HCL 500 MG PO TABS: 500.0000 mg | ORAL_TABLET | Freq: Two times a day (BID) | ORAL | Status: DC

## 2015-09-19 MED ORDER — BLOOD GLUCOSE METER KIT: PACK | Status: AC

## 2015-09-19 NOTE — Discharge Instructions (Signed)
BID wet to dry dressings.  WOUND CARE  It is important that the wound be kept open.   -Keeping the skin edges apart will allow the wound to gradually heal from the base upwards.   - If the skin edges of the wound close too early, a new fluid pocket can form and infection can occur. -This is the reason to pack deeper wounds with gauze or ribbon -This is why drained wounds cannot be sewed closed right away  A healthy wound should form a lining of bright red "beefy" granulating tissue that will help shrink the wound and help the edges grow new skin into it.   -A little mucus / yellow discharge is normal (the body's natural way to try and form a scab) and should be gently washed off with soap and water with daily dressing changes.  -Green or foul smelling drainage implies bacterial colonization and can slow wound healing - a short course of antibiotic ointment (3-5 days) can help it clear up.  Call the doctor if it does not improve or worsens  -Avoid use of antibiotic ointments for more than a week as they can slow wound healing over time.    -Sometimes other wound care products will be used to reduce need for dressing changes and/or help clean up dirty wounds -Sometimes the surgeon needs to debride the wound in the office to remove dead or infected tissue out of the wound so it can heal more quickly and safely.    Change the dressing at least once a day -Wash the wound with mild soap and water gently every day.  It is good to shower or bathe the wound to help it clean out. -Use clean 4x4 gauze for medium/large wounds or ribbon plain NU-gauze for smaller wounds (it does not need to be sterile, just clean) -Keep the raw wound moist with a little saline or KY (saline) gel on the gauze.  -A dry wound will take longer to heal.  -Keep the skin dry around the wound to prevent breakdown and irritation. -Pack the wound down to the base -The goal is to keep the skin apart, not overpack the wound -Use a  Q-tip or blunt-tipped kabob stick toothpick to push the gauze down to the base in narrow or deep wounds   -Cover with a clean gauze and tape -paper or Medipore tape tend to be gentle on the skin -rotate the orientation of the tape to avoid repeated stress/trauma on the skin -using an ACE or Coban wrap on wounds on arms or legs can be used instead.  Complete all antibiotics through the entire prescription to help the infection heal and prevent new places of infection   Returning the see the surgeon is helpful to follow the healing process and help the wound close as fast as possible.

## 2015-09-19 NOTE — Discharge Summary (Signed)
Physician Discharge Summary  Miguel Rice DYJ:[id number] DOB: 04-Dec-1965 DOA: 09/14/2015  PCP: No PCP Per Patient  Admit date: 09/14/2015 Discharge date: 09/19/2015   Recommendations for Outpatient Follow-Up:   1. Home health for BID wet to dry dressing changes 2. BMP 2 weeks re: Cr   Discharge Diagnosis:   Active Problems:   Cellulitis   Hypertension   Hyperglycemia   Leukocytosis   CKD (chronic kidney disease), stage II   Type 2 diabetes mellitus ([place])   Discharge disposition:  Home.  Discharge Condition: Improved.  Diet recommendation: Low sodium, heart healthy.  Carbohydrate-modified.  Wound care: see above   History of Present Illness:   Miguel Rice is a 50 y.o. male with no significant past medical history. That being said patient states he only goes to the doctor about every 2 years due to requirements for work. Occasionally has high blood pressure during these checks but has never been formally diagnosed with high blood pressure or any other medical condition. Patient presenting with 4 day history of left leg swelling, redness, and pain. Symptoms started on 09/10/2015 with a slight hypersensitivity to the left proximal medial thigh. This rapidly progressed to redness and tenderness and swelling. Patient seen on 09/11/2015 at the [facility] urgent care where he was given a 1 mg dose of Rocephin IM and started on Keflex and Bactrim. Patient states he's been compliant with these medications and while they have helped somewhat with the swelling, the redness and central area of induration has gotten worse. Patient's initial area of erythema/cellulitis was marked with a skin marking pen and the redness has traveled several inches outside of this. At the time of presentation to the urgent care patient was febrile to 101.7. Patient states she's never felt febrile and has also never checked his temperature before that or since. Patient takes Norco for the pain which works fairly  well. Patient denies any chest pain, shortness of breath, palpitations, other rashes, tick bites, dysuria, penile discharge, penile lesions, groin adenopathy.  Additionally patient has tried soaking his leg and Epsom salt which he thinks has helped with the swelling.   Hospital Course by Problem:   1. Cellulitis left thigh with abscess- s/p I&D on 6/12. wound care as above.  2. Hypertension, suboptimally controlled - improving after adding amlodipine, hct and lisinopril. will need outpatient titration 3. Hyperglycemia - A1c 8%, consistent with newly diagnosed diabetes mellitus.- started on metformin and given script for meter.    Medical Consultants:    surgery   Discharge Exam:   Filed Vitals:   09/18/15 2124 09/19/15 0524  BP: 156/95 152/98  Pulse: 70 66  Temp: 97.8 F (36.6 C) 98.6 F (37 C)  Resp: 17 18   Filed Vitals:   09/18/15 0935 09/18/15 1434 09/18/15 2124 09/19/15 0524  BP:  139/93 156/95 152/98  Pulse: 62 69 70 66  Temp:  98.8 F (37.1 C) 97.8 F (36.6 C) 98.6 F (37 C)  TempSrc:  Oral Oral Oral  Resp:  17 17 18   Height:      Weight:      SpO2:  100% 99% 98%    Gen:  NAD Cardiovascular:  RRR, No M/R/G    The results of significant diagnostics from this hospitalization (including imaging, microbiology, ancillary and laboratory) are listed below for reference.     Procedures and Diagnostic Studies:   [place] Extrem Low Left Ltd  09/14/2015  CLINICAL DATA:  Left proximal medial thigh pain  and swelling for 5 days. EXAM: ULTRASOUND left LOWER EXTREMITY LIMITED TECHNIQUE: Ultrasound examination of the lower extremity soft tissues was performed in the area of clinical concern. COMPARISON:  None. FINDINGS: There is edema/ inflammation in the subcutaneous fat along with streaky areas of probable fluid. No discrete drainable fluid collection to suggest an abscess. IMPRESSION: Diffuse edema/ inflammation/fluid in the subcutaneous fat without discrete drainable  fluid collection/abscess. Electronically Signed   By: Marijo Sanes M.D.   On: 09/14/2015 10:33     Labs:   Basic Metabolic Panel:  Recent Labs Lab 09/14/15 0648 09/15/15 0355 09/16/15 0525 09/18/15 0437  NA 139 138 137 138  K 3.6 3.6 3.6 4.3  CL 105 109 106 110  CO2 25 24 26  21*  GLUCOSE 144* 135* 134* 132*  BUN 11 9 10 12   CREATININE 1.23 1.13 1.30* 1.13  CALCIUM 8.6* 8.4* 8.9 8.4*   GFR Estimated Creatinine Clearance: 86.5 mL/min (by C-G formula based on Cr of 1.13). Liver Function Tests: No results for input(s): AST, ALT, ALKPHOS, BILITOT, PROT, ALBUMIN in the last 168 hours. No results for input(s): LIPASE, AMYLASE in the last 168 hours. No results for input(s): AMMONIA in the last 168 hours. Coagulation profile No results for input(s): INR, PROTIME in the last 168 hours.  CBC:  Recent Labs Lab 09/14/15 0648 09/15/15 0355 09/16/15 0525 09/18/15 0437  WBC 13.9* 10.4 11.2* 11.6*  NEUTROABS 9.3*  --   --   --   HGB 12.1* 11.3* 12.2* 11.5*  HCT 38.1* 36.3* 38.7* 37.0*  MCV 87.8 88.1 89.4 89.2  PLT 321 330 343 335   Cardiac Enzymes: No results for input(s): CKTOTAL, CKMB, CKMBINDEX, TROPONINI in the last 168 hours. BNP: Invalid input(s): POCBNP CBG:  Recent Labs Lab 09/18/15 0751 09/18/15 1209 09/18/15 1713 09/18/15 2123 09/19/15 0727  GLUCAP 158* 180* 129* 125* 128*   D-Dimer No results for input(s): DDIMER in the last 72 hours. Hgb A1c No results for input(s): HGBA1C in the last 72 hours. Lipid Profile No results for input(s): CHOL, HDL, LDLCALC, TRIG, CHOLHDL, LDLDIRECT in the last 72 hours. Thyroid function studies No results for input(s): TSH, T4TOTAL, T3FREE, THYROIDAB in the last 72 hours.  Invalid input(s): FREET3 Anemia work up No results for input(s): VITAMINB12, FOLATE, FERRITIN, TIBC, IRON, RETICCTPCT in the last 72 hours. Microbiology Recent Results (from the past 240 hour(s))  Culture, blood (routine x 2)     Status: None  (Preliminary result)   Collection Time: 09/14/15  9:36 AM  Result Value Ref Range Status   Specimen Description BLOOD LEFT ANTECUBITAL  Final   Special Requests   Final    BOTTLES DRAWN AEROBIC AND ANAEROBIC  10CC AER 5CC ANA   Culture NO GROWTH 4 DAYS  Final   Report Status PENDING  Incomplete  Culture, blood (routine x 2)     Status: None (Preliminary result)   Collection Time: 09/14/15  9:44 AM  Result Value Ref Range Status   Specimen Description BLOOD LEFT HAND  Final   Special Requests BOTTLES DRAWN AEROBIC ONLY  10CC  Final   Culture NO GROWTH 4 DAYS  Final   Report Status PENDING  Incomplete  MRSA PCR Screening     Status: None   Collection Time: 09/14/15  5:20 PM  Result Value Ref Range Status   MRSA by PCR NEGATIVE NEGATIVE Final    Comment:        The GeneXpert MRSA Assay (FDA approved for NASAL specimens  only), is one component of a comprehensive MRSA colonization surveillance program. It is not intended to diagnose MRSA infection nor to guide or monitor treatment for MRSA infections.   Surgical pcr screen     Status: None   Collection Time: 09/16/15 12:47 PM  Result Value Ref Range Status   MRSA, PCR NEGATIVE NEGATIVE Final   Staphylococcus aureus NEGATIVE NEGATIVE Final    Comment:        The Xpert SA Assay (FDA approved for NASAL specimens in patients over 39 years of age), is one component of a comprehensive surveillance program.  Test performance has been validated by [facility] for patients greater than or equal to 75 year old. It is not intended to diagnose infection nor to guide or monitor treatment. Performed at [place]   Aerobic/Anaerobic Culture(surg specimen)     Status: None (Preliminary result)   Collection Time: 09/17/15 10:38 AM  Result Value Ref Range Status   Specimen Description ABSCESS LEFT GROIN  Final   Special Requests SPECIMEN A SWABS PATIENT ON FOLLOWING  VANCOMYCIN  Final   Gram Stain   Final     ABUNDANT WBC PRESENT,BOTH PMN AND MONONUCLEAR MODERATE GRAM POSITIVE COCCI IN PAIRS IN CLUSTERS    Culture MODERATE GRAM POSITIVE COCCI  Final   Report Status PENDING  Incomplete     Discharge Instructions:   Discharge Instructions    Diet - low sodium heart healthy    Complete by:  As directed      Diet Carb Modified    Complete by:  As directed      Increase activity slowly    Complete by:  As directed             Medication List    STOP taking these medications        cephALEXin 500 MG capsule  Commonly known as:  KEFLEX      TAKE these medications        amLODipine 10 MG tablet  Commonly known as:  NORVASC  Take 1 tablet (10 mg total) by mouth daily.  Start taking on:  09/20/2015     blood glucose meter kit and supplies  Dispense based on patient and insurance preference. Use up to four times daily as directed. (FOR ICD-9 250.00, 250.01).     hydrochlorothiazide 12.5 MG capsule  Commonly known as:  MICROZIDE  Take 1 capsule (12.5 mg total) by mouth daily.     HYDROcodone-acetaminophen 5-325 MG tablet  Commonly known as:  NORCO  Take 1 tablet by mouth every 6 (six) hours as needed for moderate pain.     lisinopril 10 MG tablet  Commonly known as:  PRINIVIL,ZESTRIL  Take 1 tablet (10 mg total) by mouth daily.     metFORMIN 500 MG tablet  Commonly known as:  GLUCOPHAGE  Take 1 tablet (500 mg total) by mouth 2 (two) times daily with a meal.     sulfamethoxazole-trimethoprim 800-160 MG tablet  Commonly known as:  BACTRIM DS,SEPTRA DS  Take 1 tablet by mouth every 12 (twelve) hours.          Time coordinating discharge: 35 min  Signed:  Kaelen Brennan U Carlotta Telfair   [facility] 09/19/2015, 10:13 AM

## 2015-09-19 NOTE — Progress Notes (Signed)
Discharge papers gone over with pt. Prescriptions given to pt. Pt. States no questions/complaints. Diabetic medications also gone over with pt. Pt. States no questions/ complaints. Home health nurse saw pt. Earlier. Wound care gone over with pt. As well. IV taken out.

## 2015-09-19 NOTE — Progress Notes (Signed)
Patient ID: Miguel Rice, male   DOB: [birth year], 50 y.o.   MRN: [id number]     [facility]      [address] [place], [place] [address]    Phone: [phone] FAX: [phone]     Subjective: Afebrile.  PO pain meds given and tolerated the dressing change well.   Objective:  Vital signs:  Filed Vitals:   09/18/15 0935 09/18/15 1434 09/18/15 2124 09/19/15 0524  BP:  139/93 156/95 152/98  Pulse: 62 69 70 66  Temp:  98.8 F (37.1 C) 97.8 F (36.6 C) 98.6 F (37 C)  TempSrc:  Oral Oral Oral  Resp:  17 17 18   Height:      Weight:      SpO2:  100% 99% 98%    Last BM Date: 09/17/15  Intake/Output   Yesterday:  06/13 0701 - 06/14 0700 In: 2900 [P.O.:600; I.V.:2300] Out: -  This shift:    I/O last 3 completed shifts: In: 4010 [P.O.:840; I.V.:4555] Out: -     Physical Exam: General: Pt awake/alert/oriented x4 in no acute distress Skin: left groin-wound is clean, no areas of induration of fluctuance, no erythema.    Problem List:   Active Problems:   Cellulitis   Hypertension   Hyperglycemia   Leukocytosis   CKD (chronic kidney disease), stage II   Type 2 diabetes mellitus ([place])    Results:   Labs: Results for orders placed or performed during the hospital encounter of 09/14/15 (from the past 48 hour(s))  Aerobic/Anaerobic Culture(surg specimen)     Status: None (Preliminary result)   Collection Time: 09/17/15 10:38 AM  Result Value Ref Range   Specimen Description ABSCESS LEFT GROIN    Special Requests SPECIMEN A SWABS PATIENT ON FOLLOWING  VANCOMYCIN    Gram Stain      ABUNDANT WBC PRESENT,BOTH PMN AND MONONUCLEAR MODERATE GRAM POSITIVE COCCI IN PAIRS IN CLUSTERS    Culture MODERATE GRAM POSITIVE COCCI    Report Status PENDING   Glucose, capillary     Status: Abnormal   Collection Time: 09/17/15 11:03 AM  Result Value Ref Range   Glucose-Capillary 113 (H) 65 - 99 mg/dL   Comment 1 Notify RN    Glucose, capillary     Status: Abnormal   Collection Time: 09/17/15 12:45 PM  Result Value Ref Range   Glucose-Capillary 133 (H) 65 - 99 mg/dL   Comment 1 Notify RN   Glucose, capillary     Status: Abnormal   Collection Time: 09/17/15  5:19 PM  Result Value Ref Range   Glucose-Capillary 183 (H) 65 - 99 mg/dL   Comment 1 Notify RN   Glucose, capillary     Status: Abnormal   Collection Time: 09/17/15  9:50 PM  Result Value Ref Range   Glucose-Capillary 186 (H) 65 - 99 mg/dL  CBC     Status: Abnormal   Collection Time: 09/18/15  4:37 AM  Result Value Ref Range   WBC 11.6 (H) 4.0 - 10.5 K/uL   RBC 4.15 (L) 4.22 - 5.81 MIL/uL   Hemoglobin 11.5 (L) 13.0 - 17.0 g/dL   HCT 37.0 (L) 39.0 - 52.0 %   MCV 89.2 78.0 - 100.0 fL   MCH 27.7 26.0 - 34.0 pg   MCHC 31.1 30.0 - 36.0 g/dL   RDW 12.2 11.5 - 15.5 %   Platelets 335 150 - 400 K/uL  Basic metabolic panel  Status: Abnormal   Collection Time: 09/18/15  4:37 AM  Result Value Ref Range   Sodium 138 135 - 145 mmol/L   Potassium 4.3 3.5 - 5.1 mmol/L   Chloride 110 101 - 111 mmol/L   CO2 21 (L) 22 - 32 mmol/L   Glucose, Bld 132 (H) 65 - 99 mg/dL   BUN 12 6 - 20 mg/dL   Creatinine, Ser 1.13 0.61 - 1.24 mg/dL   Calcium 8.4 (L) 8.9 - 10.3 mg/dL   GFR calc non Af Amer >60 >60 mL/min   GFR calc Af Amer >60 >60 mL/min    Comment: (NOTE) The eGFR has been calculated using the CKD EPI equation. This calculation has not been validated in all clinical situations. eGFR's persistently <60 mL/min signify possible Chronic Kidney Disease.    Anion gap 7 5 - 15  Glucose, capillary     Status: Abnormal   Collection Time: 09/18/15  7:51 AM  Result Value Ref Range   Glucose-Capillary 158 (H) 65 - 99 mg/dL  Glucose, capillary     Status: Abnormal   Collection Time: 09/18/15 12:09 PM  Result Value Ref Range   Glucose-Capillary 180 (H) 65 - 99 mg/dL  Glucose, capillary     Status: Abnormal   Collection Time: 09/18/15  5:13 PM  Result Value Ref  Range   Glucose-Capillary 129 (H) 65 - 99 mg/dL  Glucose, capillary     Status: Abnormal   Collection Time: 09/18/15  9:23 PM  Result Value Ref Range   Glucose-Capillary 125 (H) 65 - 99 mg/dL  Glucose, capillary     Status: Abnormal   Collection Time: 09/19/15  7:27 AM  Result Value Ref Range   Glucose-Capillary 128 (H) 65 - 99 mg/dL    Imaging / Studies: No results found.  Medications / Allergies:  Scheduled Meds: . acidophilus  2 capsule Oral Daily  . amLODipine  5 mg Oral Daily  . enoxaparin (LOVENOX) injection  40 mg Subcutaneous Q24H  . hydrochlorothiazide  12.5 mg Oral Daily  . insulin aspart  0-15 Units Subcutaneous TID WC  . insulin aspart  0-5 Units Subcutaneous QHS  . lisinopril  10 mg Oral Daily  . sulfamethoxazole-trimethoprim  1 tablet Oral Q12H   Continuous Infusions: . sodium chloride 100 mL/hr at 09/18/15 1640  . lactated ringers     PRN Meds:.hydrALAZINE, HYDROcodone-acetaminophen, ondansetron **OR** ondansetron (ZOFRAN) IV, oxyCODONE-acetaminophen  Antibiotics: Anti-infectives    Start     Dose/Rate Route Frequency Ordered Stop   09/18/15 1500  sulfamethoxazole-trimethoprim (BACTRIM DS,SEPTRA DS) 800-160 MG per tablet 1 tablet     1 tablet Oral Every 12 hours 09/18/15 1338     09/16/15 0100  vancomycin (VANCOCIN) IVPB 1000 mg/200 mL premix  Status:  Discontinued     1,000 mg 200 mL/hr over 60 Minutes Intravenous Every 12 hours 09/15/15 1233 09/18/15 1338   09/15/15 1230  vancomycin (VANCOCIN) 1,750 mg in sodium chloride 0.9 % 500 mL IVPB     1,750 mg 250 mL/hr over 120 Minutes Intravenous  Once 09/15/15 1224 09/15/15 1541   09/14/15 0930  clindamycin (CLEOCIN) IVPB 600 mg  Status:  Discontinued     600 mg 100 mL/hr over 30 Minutes Intravenous Every 8 hours 09/14/15 0916 09/15/15 1208   09/14/15 0700  ceFAZolin (ANCEF) IVPB 1 g/50 mL premix     1 g 100 mL/hr over 30 Minutes Intravenous  Once 09/14/15 0658 09/14/15 0740  Assessment/Plan POD#2 left groin abscess---Dr. Hulen Skains -clean, no additional areas of fluctuance or erythema.  BID wet to dry dressing changes -HH for dressing changes, daughter who is a CNA to assist ID-[facility] on culture, total of 7 days of atbx from a surgical standpoint.  Newly diagnosed DM-A1c 8. Per medicine  FEN-no issues VTE prophylaxis-scd, lovenox Dispo-stable for discharge.  Follow up arranged.     Erby Pian, [facility] Surgery Pager [phone]) For consults and floor pages call [phone]-[phone])  09/19/2015 8:40 AM

## 2015-09-19 NOTE — Care Management Note (Signed)
Case Management Note  Patient Details  Name: Miguel Rice MRN: [id number] Date of Birth: [birth year]  Subjective/Objective:                    Action/Plan:  Discussed home health with patient . Patient aware HHRN will not be available every time dressing needs to be changed . Patient states his wife is willing to learn how to change dressing. Choice offered.   Patient does not have a PCP . Confirmed he has [employer] . Explained he can call number on insurance card and be provided with a list of MD in network with [employer] . Provided [facility] number if he wants a [place] MD and also [facility] . Patient voiced understanding.  Expected Discharge Date:                  Expected Discharge Plan:  Home w Home Health Services  In-House Referral:     Discharge planning Services  CM Consult  Post Acute Care Choice:    Choice offered to:  Patient  DME Arranged:    DME Agency:     HH Arranged:  RN HH Agency:  [facility]  Status of Service:  Completed, signed off  Medicare Important Message Given:    Date Medicare IM Given:    Medicare IM give by:    Date Additional Medicare IM Given:    Additional Medicare Important Message give by:     If discussed at Long Length of Stay Meetings, dates discussed:    Additional Comments:  Kingsley PlanWile, Carri Spillers Marie, RN 09/19/2015, 10:31 AM

## 2015-09-22 LAB — AEROBIC/ANAEROBIC CULTURE (SURGICAL/DEEP WOUND)

## 2015-09-22 LAB — AEROBIC/ANAEROBIC CULTURE W GRAM STAIN (SURGICAL/DEEP WOUND)

## 2015-11-29 ENCOUNTER — Encounter (HOSPITAL_COMMUNITY): Payer: Self-pay

## 2017-07-05 IMAGING — US US EXTREM LOW*L* LIMITED
1 series · 14 of 25 positions shown · non-contrast
Comparison: None.

CLINICAL DATA: Left proximal medial thigh pain and swelling for 5
days.

EXAM:
ULTRASOUND left LOWER EXTREMITY LIMITED
TECHNIQUE: Ultrasound examination of the lower extremity soft tissues was
performed in the area of clinical concern.

[Series 1: us extrem low*left* limited · 0.12mm/px · 14 of 26 slices shown]
[im 1/26]
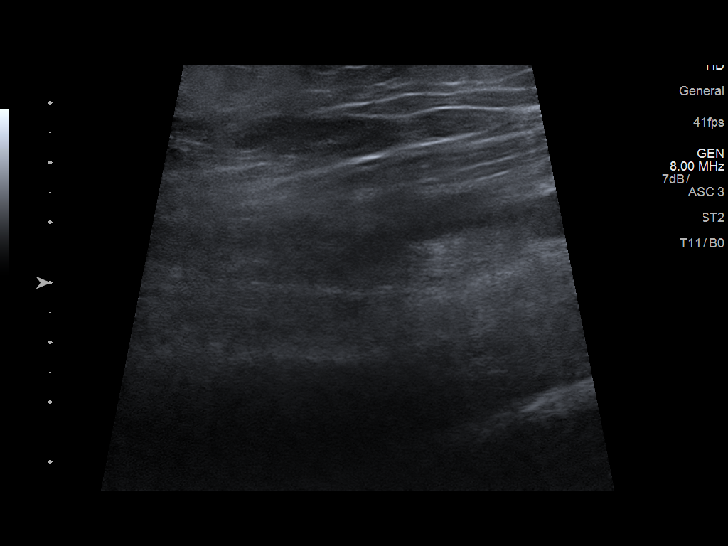
[im 3/26]
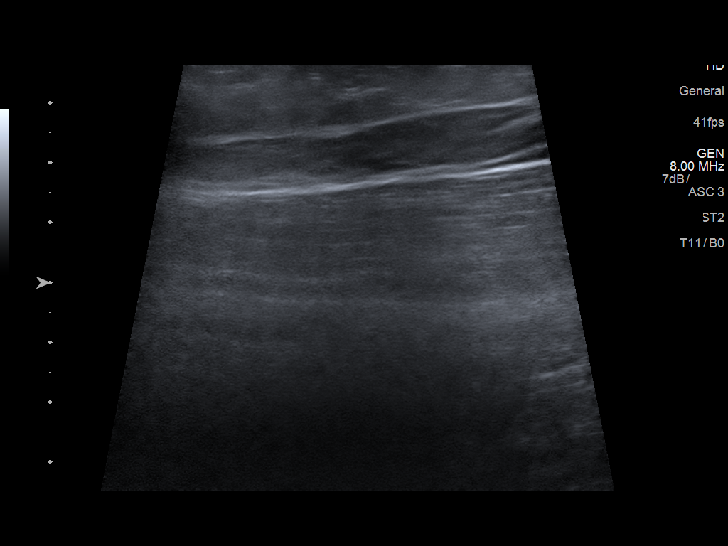
[im 5/26]
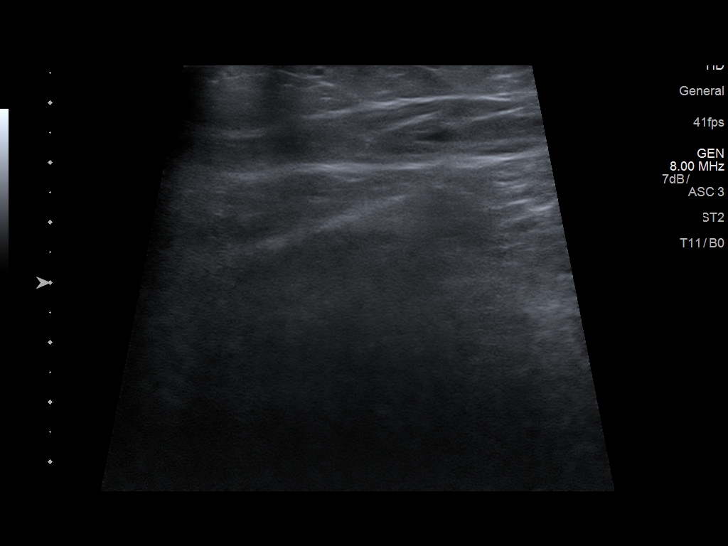
[im 7/26]
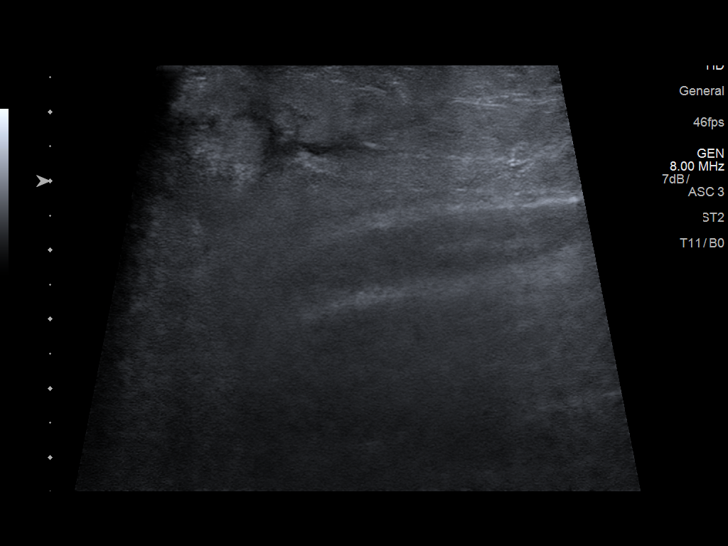
[im 9/26]
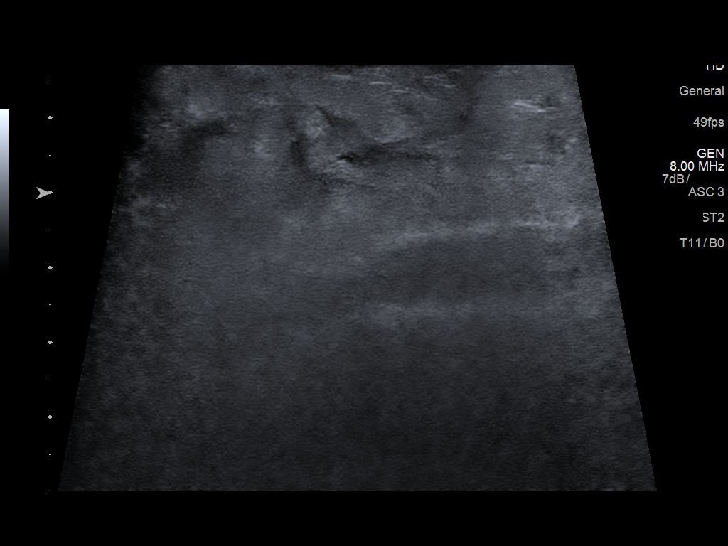
[im 10/26]
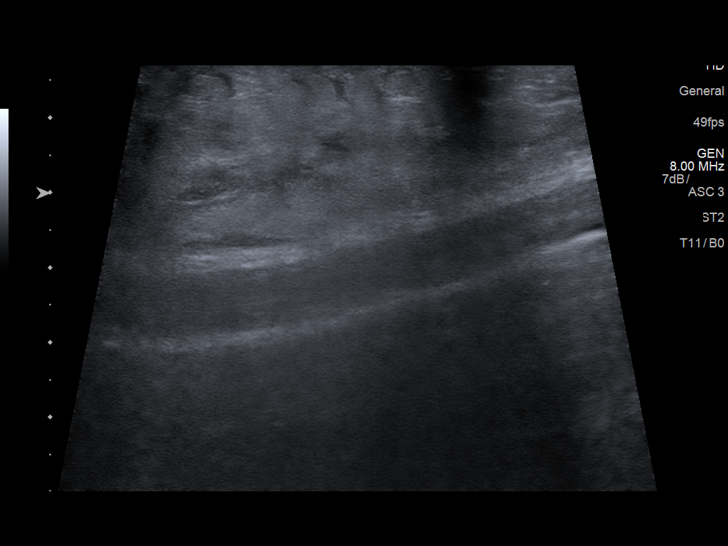
[im 12/26]
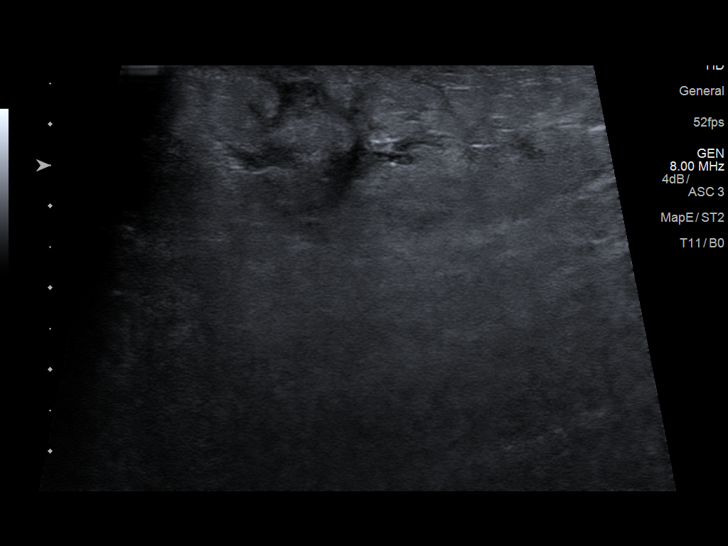
[im 14/26]
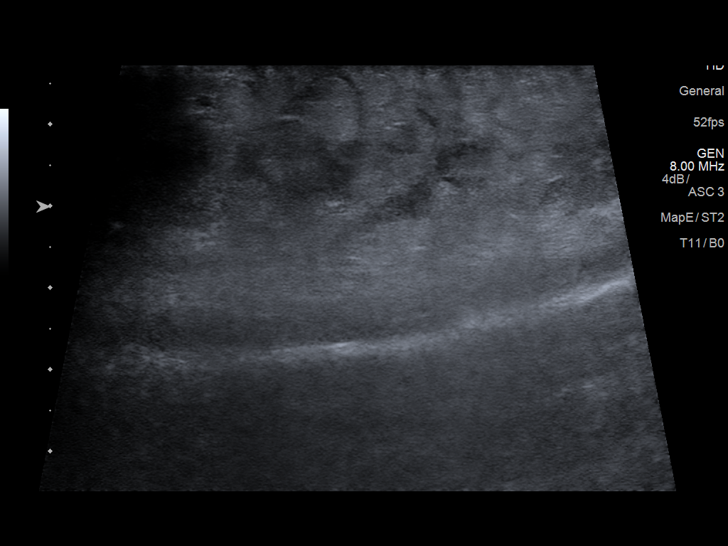
[im 16/26]
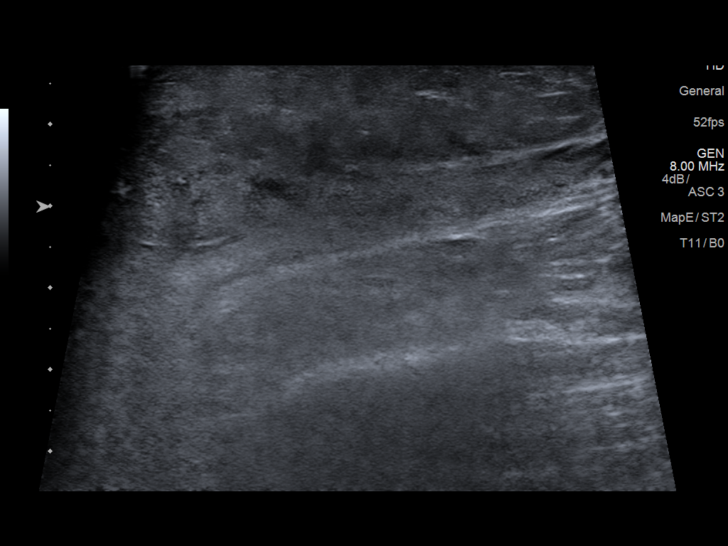
[im 17/26]
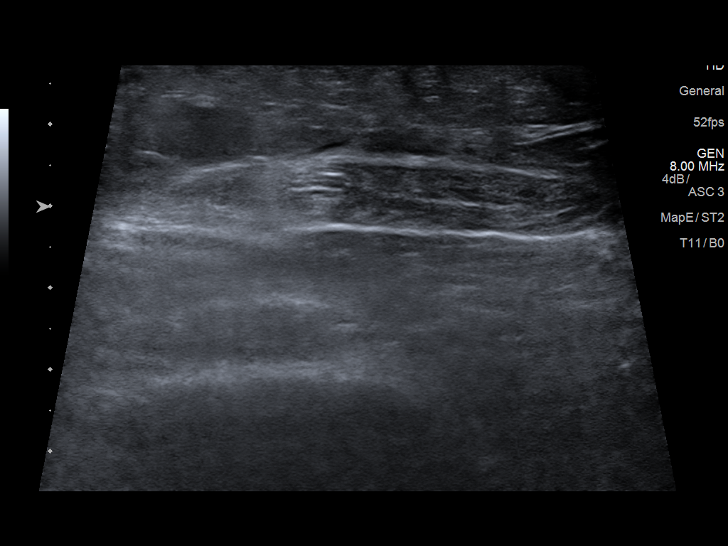
[im 19/26]
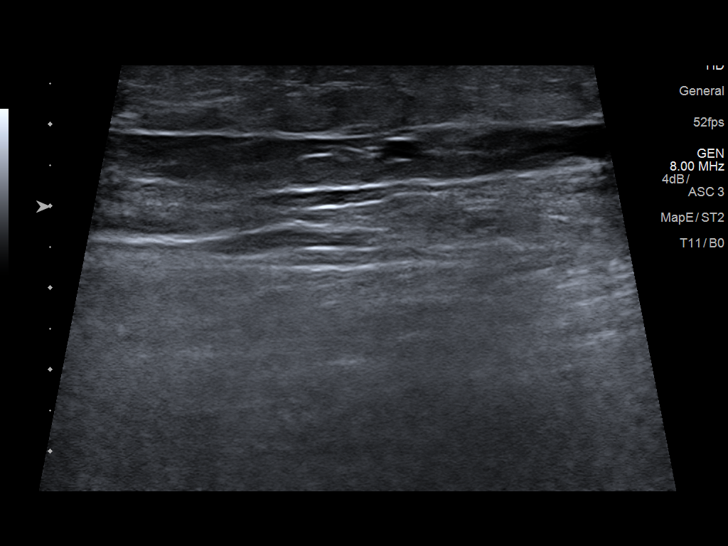
[im 21/26]
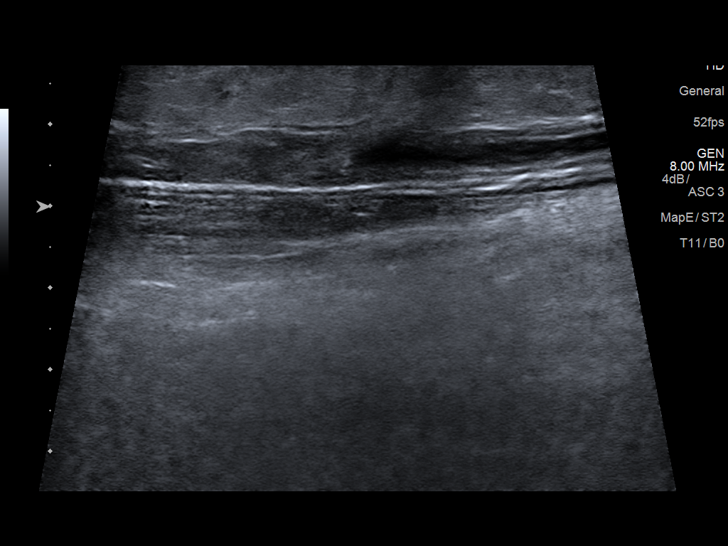
[im 23/26]
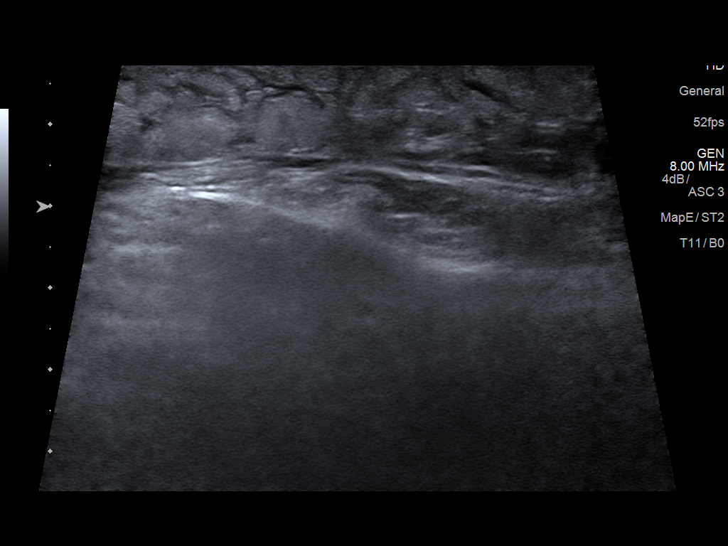
[im 26/26]
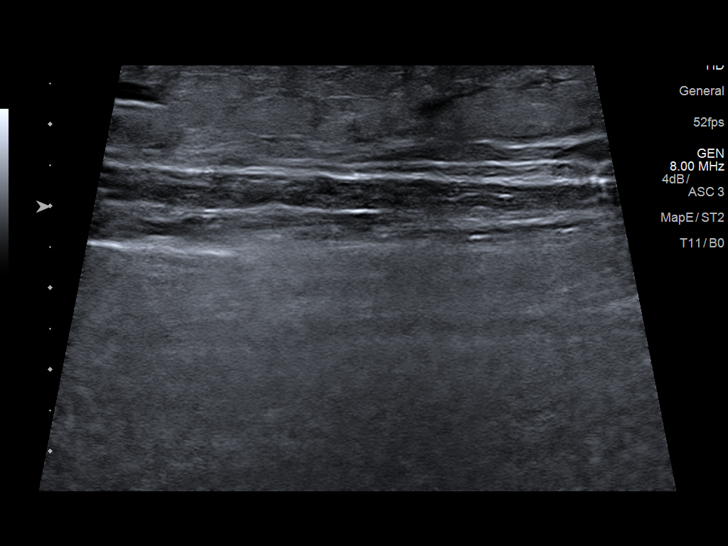

[14 of 25 positions shown; findings below may reference images not displayed]

FINDINGS: There is edema/ inflammation in the subcutaneous fat along with
streaky areas of probable fluid. No discrete drainable fluid
collection to suggest an abscess.
IMPRESSION: Diffuse edema/ inflammation/fluid in the subcutaneous fat without
discrete drainable fluid collection/abscess.

## 2018-12-17 ENCOUNTER — Other Ambulatory Visit: Payer: Self-pay

## 2018-12-17 ENCOUNTER — Ambulatory Visit (INDEPENDENT_AMBULATORY_CARE_PROVIDER_SITE_OTHER): Payer: BC Managed Care – PPO | Admitting: Urology

## 2018-12-17 DIAGNOSIS — N5201 Erectile dysfunction due to arterial insufficiency: Secondary | ICD-10-CM | POA: Diagnosis not present

## 2019-03-11 ENCOUNTER — Other Ambulatory Visit: Payer: Self-pay

## 2019-03-11 ENCOUNTER — Ambulatory Visit (INDEPENDENT_AMBULATORY_CARE_PROVIDER_SITE_OTHER): Payer: BC Managed Care – PPO | Admitting: Urology

## 2019-03-11 DIAGNOSIS — E230 Hypopituitarism: Secondary | ICD-10-CM

## 2019-03-11 DIAGNOSIS — N5201 Erectile dysfunction due to arterial insufficiency: Secondary | ICD-10-CM

## 2019-04-13 ENCOUNTER — Other Ambulatory Visit: Payer: Self-pay | Admitting: Urology

## 2019-04-14 ENCOUNTER — Other Ambulatory Visit: Payer: Self-pay | Admitting: Urology

## 2019-04-14 LAB — TESTOSTERONE TOTAL,FREE,BIO, MALES
Albumin: 4.1 g/dL (ref 3.6–5.1)
Sex Hormone Binding: 12 nmol/L (ref 10–50)
Testosterone: 212 ng/dL — ABNORMAL LOW (ref 250–827)

## 2019-04-14 LAB — HEMOGLOBIN AND HEMATOCRIT, BLOOD
HCT: 40.7 % (ref 38.5–50.0)
Hemoglobin: 13.6 g/dL (ref 13.2–17.1)

## 2019-04-14 LAB — PSA: PSA: 0.7 ng/mL (ref <=4.0)

## 2019-04-29 ENCOUNTER — Ambulatory Visit: Payer: BC Managed Care – PPO | Admitting: Urology

## 2019-06-10 ENCOUNTER — Other Ambulatory Visit: Payer: Self-pay

## 2019-06-10 ENCOUNTER — Ambulatory Visit (INDEPENDENT_AMBULATORY_CARE_PROVIDER_SITE_OTHER): Payer: BC Managed Care – PPO | Admitting: Urology

## 2019-06-10 ENCOUNTER — Encounter: Payer: Self-pay | Admitting: Urology

## 2019-06-10 VITALS — BP 159/91 | HR 69 | Temp 97.2°F | Ht 69.0 in | Wt 203.0 lb

## 2019-06-10 DIAGNOSIS — N5201 Erectile dysfunction due to arterial insufficiency: Secondary | ICD-10-CM

## 2019-06-10 DIAGNOSIS — E291 Testicular hypofunction: Secondary | ICD-10-CM | POA: Diagnosis not present

## 2019-06-10 MED ORDER — CLOMIPHENE CITRATE 50 MG PO TABS: 25.0000 mg | ORAL_TABLET | Freq: Every day | ORAL | 3 refills | Status: AC

## 2019-06-10 NOTE — Progress Notes (Signed)
06/10/2019 2:15 PM   FOX SALMINEN April 24, 1965 [id number]  Referring provider: No referring provider defined for this encounter.  ED  HPI: Mr Miguel Rice is a 54yo here for followup for hypogonadism and ED. Last testosterone 3 months ago was 228. He is fatigued, has low libido. No LUTS. Erectile dysfunction has been refractory to sildenafil and tadalafil   PMH: Past Medical History:  Diagnosis Date  . Medical history non-contributory     Surgical History: Past Surgical History:  Procedure Laterality Date  . IRRIGATION AND DEBRIDEMENT ABSCESS Left 09/17/2015   Procedure: IRRIGATION AND DEBRIDEMENT ABSCESS LEFT GROIN;  Surgeon: Judeth Horn, MD;  Location: [facility];  Service: General;  Laterality: Left;  . NO PAST SURGERIES      Home Medications:  Allergies as of 06/10/2019   No Known Allergies     Medication List       Accurate as of June 10, 2019  2:15 PM. If you have any questions, ask your nurse or doctor.        STOP taking these medications   amLODipine 10 MG tablet Commonly known as: NORVASC Stopped by: Nicolette Bang, MD   hydrochlorothiazide 12.5 MG capsule Commonly known as: MICROZIDE Stopped by: Nicolette Bang, MD   HYDROcodone-acetaminophen 5-325 MG tablet Commonly known as: Norco Stopped by: Nicolette Bang, MD   metFORMIN 500 MG tablet Commonly known as: Glucophage Stopped by: Nicolette Bang, MD   olmesartan 40 MG tablet Commonly known as: BENICAR Stopped by: Nicolette Bang, MD   sulfamethoxazole-trimethoprim 800-160 MG tablet Commonly known as: BACTRIM DS Stopped by: Nicolette Bang, MD     TAKE these medications   amLODipine-benazepril 5-40 MG capsule Commonly known as: LOTREL Take by mouth.   blood glucose meter kit and supplies Dispense based on patient and insurance preference. Use up to four times daily as directed. (FOR ICD-9 250.00, 250.01).   lisinopril 10 MG tablet Commonly known as: ZESTRIL Take 1 tablet (10 mg total)  by mouth daily.   sildenafil 20 MG tablet Commonly known as: REVATIO TAKE 1 5 TABLETS BY MOUTH AS NEEDED   tadalafil 5 MG tablet Commonly known as: CIALIS Take 5 mg by mouth daily.       Allergies: No Known Allergies  Family History: Family History  Problem Relation Age of Onset  . Diabetes Unknown   . Hypertension Unknown     Social History:  reports that he has never smoked. He has never used smokeless tobacco. He reports current alcohol use. He reports that he does not use drugs.  ROS: All other review of systems were reviewed and are negative except what is noted above in HPI  Physical Exam: BP (!) 159/91   Pulse 69   Temp (!) 97.2 F (36.2 C)   Ht 5' 9"  (1.753 m)   Wt 203 lb (92.1 kg)   BMI 29.98 kg/m   Constitutional:  Alert and oriented, No acute distress. HEENT: [place] AT, moist mucus membranes.  Trachea midline, no masses. Cardiovascular: No clubbing, cyanosis, or edema. Respiratory: Normal respiratory effort, no increased work of breathing. GI: Abdomen is soft, nontender, nondistended, no abdominal masses GU: No CVA tenderness Lymph: No cervical or inguinal lymphadenopathy. Skin: No rashes, bruises or suspicious lesions. Neurologic: Grossly intact, no focal deficits, moving all 4 extremities. Psychiatric: Normal mood and affect.  Laboratory Data: Lab Results  Component Value Date   WBC 11.6 (H) 09/18/2015   HGB 13.6 04/13/2019   HCT 40.7 04/13/2019   MCV 89.2  09/18/2015   PLT 335 09/18/2015    Lab Results  Component Value Date   CREATININE 1.13 09/18/2015    Lab Results  Component Value Date   PSA 0.7 04/13/2019    Lab Results  Component Value Date   TESTOSTERONE 212 (L) 04/13/2019    Lab Results  Component Value Date   HGBA1C 8.0 (H) 09/15/2015    Urinalysis No results found for: COLORURINE, APPEARANCEUR, LABSPEC, PHURINE, GLUCOSEU, HGBUR, BILIRUBINUR, KETONESUR, PROTEINUR, UROBILINOGEN, NITRITE, LEUKOCYTESUR  No results found  for: LABMICR, [place], RBCUA, LABEPIT, MUCUS, BACTERIA  Pertinent Imaging:  No results found for this or any previous visit. No results found for this or any previous visit. No results found for this or any previous visit. No results found for this or any previous visit. No results found for this or any previous visit. No results found for this or any previous visit. No results found for this or any previous visit. No results found for this or any previous visit.  Assessment & Plan:    1. Erectile dysfunction due to arterial insufficiency -clomid 51m dail  2. Hypogonadism male clomd 213mdaily --RTC 3 months with testosterone labs   No follow-ups on file.  PaNicolette BangMD  [facility]

## 2019-06-10 NOTE — Progress Notes (Signed)

## 2019-06-10 NOTE — Patient Instructions (Signed)
Testosterone Capsules What is this medicine? TESTOSTERONE (tes TOS ter one) is the main male hormone. It supports normal male development such as muscle growth, facial hair, and deep voice. It is used in males to treat low testosterone levels. This medicine may be used for other purposes; ask your health care provider or pharmacist if you have questions. COMMON BRAND NAME(S): JATENZO What should I tell my health care provider before I take this medicine? They need to know if you have any of these conditions:  cancer  depression  diabetes  heart disease  high blood pressure  high red blood cell count  kidney disease  liver disease  lung or breathing disease  prostate disease  suicidal thoughts, plans, or attempt; a previous suicide attempt by you or a family member  an unusual or allergic reaction to testosterone, other medicines, foods, dyes, or preservatives  pregnant or trying to get pregnant  breast-feeding How should I use this medicine? Take this medicine by mouth with a glass of water. Follow the directions on the prescription label. Take this medicine with food. Take your doses at regular intervals. Do not take your medicine more often than directed. Do not stop taking except on your doctor's advice. A special MedGuide will be given to you by the pharmacist with each prescription and refill. Be sure to read this information carefully each time. Talk to your pediatrician regarding the use of this medicine in children. Special care may be needed Overdosage: If you think you have taken too much of this medicine contact a poison control center or emergency room at once. NOTE: This medicine is only for you. Do not share this medicine with others. What if I miss a dose? If you miss a dose, take it as soon as you can. If it is almost time for your next dose, take only that dose. Do not take double or extra doses. Call your doctor or health care professional if you are not  sure how to handle a missed dose. What may interact with this medicine?  certain medicines for colds or congestion, like ephedrine, phenylephrine, or pseudoephedrine  certain medicines that treat or prevent blood clots like warfarin  medicines for diabetes  steroid medicines like prednisone or cortisone This list may not describe all possible interactions. Give your health care provider a list of all the medicines, herbs, non-prescription drugs, or dietary supplements you use. Also tell them if you smoke, drink alcohol, or use illegal drugs. Some items may interact with your medicine. What should I watch for while using this medicine? Visit your doctor or health care professional for regular checks on your progress. They will need to check the level of testosterone in your blood. This medicine is only approved for use in men who have low levels of testosterone related to certain medical conditions. Heart attacks and strokes have been reported with the use of this medicine. Notify your doctor or health care professional and seek emergency treatment if you develop breathing problems; changes in vision; confusion; chest pain or chest tightness; sudden arm pain; severe, sudden headache; trouble speaking or understanding; sudden numbness or weakness of the face, arm or leg; loss of balance or coordination. Talk to your doctor about the risks and benefits of this medicine. This medicine may affect blood sugar levels. If you have diabetes, check with your doctor or health care professional before you change your diet or the dose of your diabetic medicine. This drug is banned from use in athletes  by most athletic organizations. What side effects may I notice from receiving this medicine? Side effects that you should report to your doctor or health care professional as soon as possible:  allergic reactions like skin rash, itching or hives, swelling of the face, lips, or tongue  breast  enlargement  breathing problems  high blood pressure  prolonged or painful erection  signs and symptoms of a blood clot such as chest pain; shortness of breath; pain, swelling, or warmth in the leg  signs and symptoms of liver injury like dark yellow or brown urine; general ill feeling or flu-like symptoms; light-colored stools; loss of appetite; nausea; right upper belly pain; unusually weak or tired; yellowing of the eyes or skin  suicidal thoughts  swelling of the ankles, feet, hands  trouble passing urine or change in the amount of urine Side effects that usually do not require medical attention (report these to your doctor or health care professional if they continue or are bothersome):  burping  diarrhea  headache  heartburn  nausea This list may not describe all possible side effects. Call your doctor for medical advice about side effects. You may report side effects to FDA at 1-800-FDA-1088. Where should I keep my medicine? Keep out of the reach of children. Store between 15 and 30 degrees C (59 and 86 degrees F). Avoid exposing the capsules to moisture (store in a dry place). Throw away any unused medicine after the expiration date on the label. NOTE: This sheet is a summary. It may not cover all possible information. If you have questions about this medicine, talk to your doctor, pharmacist, or health care provider.  2020 Elsevier/Gold Standard (2017-07-03 12:16:27)

## 2019-09-09 ENCOUNTER — Ambulatory Visit: Payer: BC Managed Care – PPO | Admitting: Urology

## 2019-12-13 ENCOUNTER — Other Ambulatory Visit: Payer: Self-pay

## 2019-12-14 ENCOUNTER — Other Ambulatory Visit: Payer: Self-pay

## 2019-12-14 ENCOUNTER — Other Ambulatory Visit: Payer: BC Managed Care – PPO

## 2019-12-14 DIAGNOSIS — Z20822 Contact with and (suspected) exposure to covid-19: Secondary | ICD-10-CM

## 2019-12-16 LAB — NOVEL CORONAVIRUS, NAA: SARS-CoV-2, NAA: NOT DETECTED

## 2019-12-16 LAB — SARS-COV-2, NAA 2 DAY TAT

## 2019-12-20 ENCOUNTER — Other Ambulatory Visit: Payer: Self-pay

## 2019-12-20 ENCOUNTER — Other Ambulatory Visit: Payer: BC Managed Care – PPO

## 2019-12-20 DIAGNOSIS — Z20822 Contact with and (suspected) exposure to covid-19: Secondary | ICD-10-CM

## 2019-12-21 ENCOUNTER — Other Ambulatory Visit: Payer: BC Managed Care – PPO

## 2019-12-22 LAB — SARS-COV-2, NAA 2 DAY TAT

## 2019-12-22 LAB — NOVEL CORONAVIRUS, NAA: SARS-CoV-2, NAA: NOT DETECTED

## 2019-12-27 ENCOUNTER — Other Ambulatory Visit: Payer: BC Managed Care – PPO

## 2019-12-27 ENCOUNTER — Other Ambulatory Visit: Payer: Self-pay | Admitting: Internal Medicine

## 2019-12-27 DIAGNOSIS — Z20822 Contact with and (suspected) exposure to covid-19: Secondary | ICD-10-CM

## 2019-12-29 LAB — SARS-COV-2, NAA 2 DAY TAT

## 2019-12-29 LAB — NOVEL CORONAVIRUS, NAA: SARS-CoV-2, NAA: NOT DETECTED

## 2021-07-23 ENCOUNTER — Other Ambulatory Visit (HOSPITAL_COMMUNITY): Payer: Self-pay | Admitting: Sports Medicine

## 2021-07-23 ENCOUNTER — Other Ambulatory Visit: Payer: Self-pay | Admitting: Sports Medicine

## 2021-07-23 DIAGNOSIS — Z0189 Encounter for other specified special examinations: Secondary | ICD-10-CM

## 2021-07-23 DIAGNOSIS — S83249S Other tear of medial meniscus, current injury, unspecified knee, sequela: Secondary | ICD-10-CM

## 2021-08-02 ENCOUNTER — Ambulatory Visit (HOSPITAL_COMMUNITY): Payer: BC Managed Care – PPO

## 2021-08-09 ENCOUNTER — Ambulatory Visit (HOSPITAL_COMMUNITY)
Admission: RE | Admit: 2021-08-09 | Discharge: 2021-08-09 | Disposition: A | Discharge status: Home / Self Care | Payer: BC Managed Care – PPO | Source: Ambulatory Visit | Attending: Sports Medicine | Admitting: Sports Medicine

## 2021-08-09 DIAGNOSIS — S83249S Other tear of medial meniscus, current injury, unspecified knee, sequela: Secondary | ICD-10-CM | POA: Insufficient documentation

## 2021-08-09 DIAGNOSIS — S83241A Other tear of medial meniscus, current injury, right knee, initial encounter: Secondary | ICD-10-CM | POA: Insufficient documentation

## 2021-08-09 DIAGNOSIS — Z0189 Encounter for other specified special examinations: Secondary | ICD-10-CM | POA: Insufficient documentation

## 2023-04-19 ENCOUNTER — Other Ambulatory Visit: Payer: Self-pay

## 2023-04-19 ENCOUNTER — Emergency Department (HOSPITAL_COMMUNITY)
Admission: EM | Admit: 2023-04-19 | Discharge: 2023-04-19 | Disposition: A | Discharge status: Home / Self Care | Payer: 59 | Attending: Emergency Medicine | Admitting: Emergency Medicine

## 2023-04-19 ENCOUNTER — Encounter (HOSPITAL_COMMUNITY): Payer: Self-pay

## 2023-04-19 DIAGNOSIS — R739 Hyperglycemia, unspecified: Secondary | ICD-10-CM | POA: Diagnosis not present

## 2023-04-19 DIAGNOSIS — I10 Essential (primary) hypertension: Secondary | ICD-10-CM | POA: Diagnosis present

## 2023-04-19 HISTORY — DX: Essential (primary) hypertension: I10

## 2023-04-19 HISTORY — DX: Type 2 diabetes mellitus without complications: E11.9

## 2023-04-19 LAB — CBC
HCT: 42.6 % (ref 39.0–52.0)
Hemoglobin: 14.2 g/dL (ref 13.0–17.0)
MCH: 29.8 pg (ref 26.0–34.0)
MCHC: 33.3 g/dL (ref 30.0–36.0)
MCV: 89.5 fL (ref 80.0–100.0)
Platelets: 319 10*3/uL (ref 150–400)
RBC: 4.76 MIL/uL (ref 4.22–5.81)
RDW: 11.4 % — ABNORMAL LOW (ref 11.5–15.5)
WBC: 11.5 10*3/uL — ABNORMAL HIGH (ref 4.0–10.5)
nRBC: 0 % (ref 0.0–0.2)

## 2023-04-19 LAB — CBG MONITORING, ED: Glucose-Capillary: 410 mg/dL — ABNORMAL HIGH (ref 70–99)

## 2023-04-19 LAB — COMPREHENSIVE METABOLIC PANEL
ALT: 17 U/L (ref 0–44)
AST: 14 U/L — ABNORMAL LOW (ref 15–41)
Albumin: 3.4 g/dL — ABNORMAL LOW (ref 3.5–5.0)
Alkaline Phosphatase: 95 U/L (ref 38–126)
Anion gap: 8 (ref 5–15)
BUN: 16 mg/dL (ref 6–20)
CO2: 25 mmol/L (ref 22–32)
Calcium: 8.7 mg/dL — ABNORMAL LOW (ref 8.9–10.3)
Chloride: 101 mmol/L (ref 98–111)
Creatinine, Ser: 1.15 mg/dL (ref 0.61–1.24)
GFR, Estimated: >60 mL/min (ref >60)
Glucose, Bld: 363 mg/dL — ABNORMAL HIGH (ref 70–99)
Potassium: 4.1 mmol/L (ref 3.5–5.1)
Sodium: 134 mmol/L — ABNORMAL LOW (ref 135–145)
Total Bilirubin: 0.3 mg/dL (ref 0.0–1.2)
Total Protein: 6.6 g/dL (ref 6.5–8.1)

## 2023-04-19 LAB — URINALYSIS, ROUTINE W REFLEX MICROSCOPIC
Bacteria, UA: NONE SEEN
Bilirubin Urine: NEGATIVE
Glucose, UA: >=500 mg/dL — AB
Hgb urine dipstick: NEGATIVE
Ketones, ur: NEGATIVE mg/dL
Nitrite: NEGATIVE
Protein, ur: NEGATIVE mg/dL
Specific Gravity, Urine: 1.032 — ABNORMAL HIGH (ref 1.005–1.030)
pH: 7 (ref 5.0–8.0)

## 2023-04-19 MED ORDER — METFORMIN HCL 500 MG PO TABS
500.0000 mg | ORAL_TABLET | Freq: Once | ORAL | Status: AC
  Administered 2023-04-19: 500 mg via ORAL
  Filled 2023-04-19: qty 1

## 2023-04-19 MED ORDER — AMLODIPINE BESYLATE 5 MG PO TABS
5.0000 mg | ORAL_TABLET | Freq: Once | ORAL | Status: AC
  Administered 2023-04-19: 5 mg via ORAL
  Filled 2023-04-19: qty 1

## 2023-04-19 MED ORDER — AMLODIPINE BESY-BENAZEPRIL HCL 5-20 MG PO CAPS: 1.0000 | ORAL_CAPSULE | Freq: Every day | ORAL | 1 refills | Status: AC

## 2023-04-19 MED ORDER — METFORMIN HCL 500 MG PO TABS: 500.0000 mg | ORAL_TABLET | Freq: Two times a day (BID) | ORAL | 1 refills | Status: AC

## 2023-04-19 NOTE — ED Triage Notes (Signed)
 Pt arrived [facility] for c/o HTN, and hyperglycemia. [facility] 343 for EMS. Pt alert and verbal and does not go to Dr's.

## 2023-04-19 NOTE — Discharge Instructions (Signed)
Follow-up with your family doctor in the next couple weeks. °

## 2023-04-19 NOTE — ED Provider Notes (Signed)
 [place] [facility] DE[facility]MENT AT [facility] Provider Note   CSN: [id number] Arrival date & time: 04/19/23  1211     History  Chief Complaint  Patient presents with   Hypertension   Hyperglycemia    Miguel Rice is a 58 y.o. male.  Patient has a history of hypertension.  He has been feeling kind of weak.  He has not been taking his medicines.  The history is provided by the patient and medical records. No language interpreter was used.  Weakness Severity:  Moderate Onset quality:  Gradual Timing:  Intermittent Progression:  Waxing and waning Chronicity:  New Context: not alcohol use   Relieved by:  Nothing Worsened by:  Nothing Ineffective treatments:  None tried Associated symptoms: no abdominal pain, no chest pain, no cough, no diarrhea, no frequency, no headaches and no seizures        Home Medications Prior to Admission medications   Medication Sig Start Date End Date Taking? Authorizing Provider  amLODipine -benazepril  (LOTREL) 5-20 MG capsule Take 1 capsule by mouth daily. 04/19/23  Yes Suzette Pac, MD  metFORMIN  (GLUCOPHAGE ) 500 MG tablet Take 1 tablet (500 mg total) by mouth 2 (two) times daily with a meal. 04/19/23  Yes Suzette Pac, MD  blood glucose meter kit and supplies Dispense based on patient and insurance preference. Use up to four times daily as directed. (FOR ICD-9 250.00, 250.01). 09/19/15   Juvenal Harlene PENNER, DO  clomiPHENE  (CLOMID ) 50 MG tablet Take 0.5 tablets (25 mg total) by mouth daily. 06/10/19   McKenzie, Belvie CROME, MD  sildenafil (REVATIO) 20 MG tablet TAKE 1 5 TABLETS BY MOUTH AS NEEDED 12/25/18   [provider]  tadalafil (CIALIS) 5 MG tablet Take 5 mg by mouth daily. 12/25/18   [provider]      Allergies    Patient has no known allergies.    Review of Systems   Review of Systems  Constitutional:  Negative for appetite change and fatigue.  HENT:  Negative for congestion, ear discharge and sinus  pressure.   Eyes:  Negative for discharge.  Respiratory:  Negative for cough.   Cardiovascular:  Negative for chest pain.  Gastrointestinal:  Negative for abdominal pain and diarrhea.  Genitourinary:  Negative for frequency and hematuria.  Musculoskeletal:  Negative for back pain.  Skin:  Negative for rash.  Neurological:  Positive for weakness. Negative for seizures and headaches.  Psychiatric/Behavioral:  Negative for hallucinations.     Physical Exam Updated Vital Signs BP (!) 167/112   Pulse 83   Temp 98.4 F (36.9 C) (Oral)   Resp 17   Ht 5' 9 (1.753 m)   Wt 93 kg   SpO2 95%   BMI 30.27 kg/m  Physical Exam Vitals and nursing note reviewed.  Constitutional:      Appearance: He is well-developed.  HENT:     Head: Normocephalic.     Nose: Nose normal.  Eyes:     General: No scleral icterus.    Conjunctiva/sclera: Conjunctivae normal.  Neck:     Thyroid: No thyromegaly.  Cardiovascular:     Rate and Rhythm: Normal rate and regular rhythm.     Heart sounds: No murmur heard.    No friction rub. No gallop.  Pulmonary:     Breath sounds: No stridor. No wheezing or rales.  Chest:     Chest wall: No tenderness.  Abdominal:     General: There is no distension.  Tenderness: There is no abdominal tenderness. There is no rebound.  Musculoskeletal:        General: Normal range of motion.     Cervical back: Neck supple.  Lymphadenopathy:     Cervical: No cervical adenopathy.  Skin:    Findings: No erythema or rash.  Neurological:     Mental Status: He is alert and oriented to person, place, and time.     Motor: No abnormal muscle tone.     Coordination: Coordination normal.  Psychiatric:        Behavior: Behavior normal.     ED Results / Procedures / Treatments   Labs (all labs ordered are listed, but only abnormal results are displayed) Labs Reviewed  CBC - Abnormal; Notable for the following components:      Result Value   WBC 11.5 (*)    RDW 11.4 (*)     All other components within normal limits  URINALYSIS, ROUTINE W REFLEX MICROSCOPIC - Abnormal; Notable for the following components:   Specific Gravity, Urine 1.032 (*)    Glucose, UA >=500 (*)    Leukocytes,Ua TRACE (*)    All other components within normal limits  COMPREHENSIVE METABOLIC PANEL - Abnormal; Notable for the following components:   Sodium 134 (*)    Glucose, Bld 363 (*)    Calcium 8.7 (*)    Albumin 3.4 (*)    AST 14 (*)    All other components within normal limits  CBG MONITORING, ED - Abnormal; Notable for the following components:   Glucose-Capillary 410 (*)    All other components within normal limits    EKG EKG Interpretation Date/Time:  Sunday April 19 2023 14:33:59 EST Ventricular Rate:  84 PR Interval:  138 QRS Duration:  84 QT Interval:  366 QTC Calculation: 433 R Axis:   39  Text Interpretation: Sinus rhythm Probable LVH with secondary repol abnrm Confirmed by Windsor Goeken ([id number]) on 04/19/2023 2:35:25 PM  Radiology No results found.  Procedures Procedures    Medications Ordered in ED Medications  amLODipine (NORVASC) tablet 5 mg (5 mg Oral Given 04/19/23 1434)  metFORMIN (GLUCOPHAGE) tablet 500 mg (500 mg Oral Given 04/19/23 1434)    ED Course/ Medical Decision Making/ A&P                                 Medical Decision Making Amount and/or Complexity of Data Reviewed Labs: ordered. ECG/medicine tests: ordered.  Risk Prescription drug management.   Patient with new onset diabetes.  Also a poorly controlled hypertension.  He is sent home on blood pressure medicine and Glucophage and will follow-up with his PCP        Final Clinical Impression(s) / ED Diagnoses Final diagnoses:  Primary hypertension  Hyperglycemia    Rx / DC Orders ED Discharge Orders          Ordered    metFORMIN (GLUCOPHAGE) 500 MG tablet  2 times daily with meals        04/19/23 1439    amLODipine-benazepril (LOTREL) 5-20 MG capsule  Daily         01 /12/25 1439              Suzette Pac, MD 04/20/23 1556

## 2024-01-03 IMAGING — DX DG FOREARM 2V*L*
2 series · 2 of 2 positions shown · non-contrast
Comparison: None Available.

CLINICAL DATA: History of small piece of metal to left forearm.
Screening for MRI.

EXAM:
LEFT FOREARM - 2 VIEW

[forearm ap]
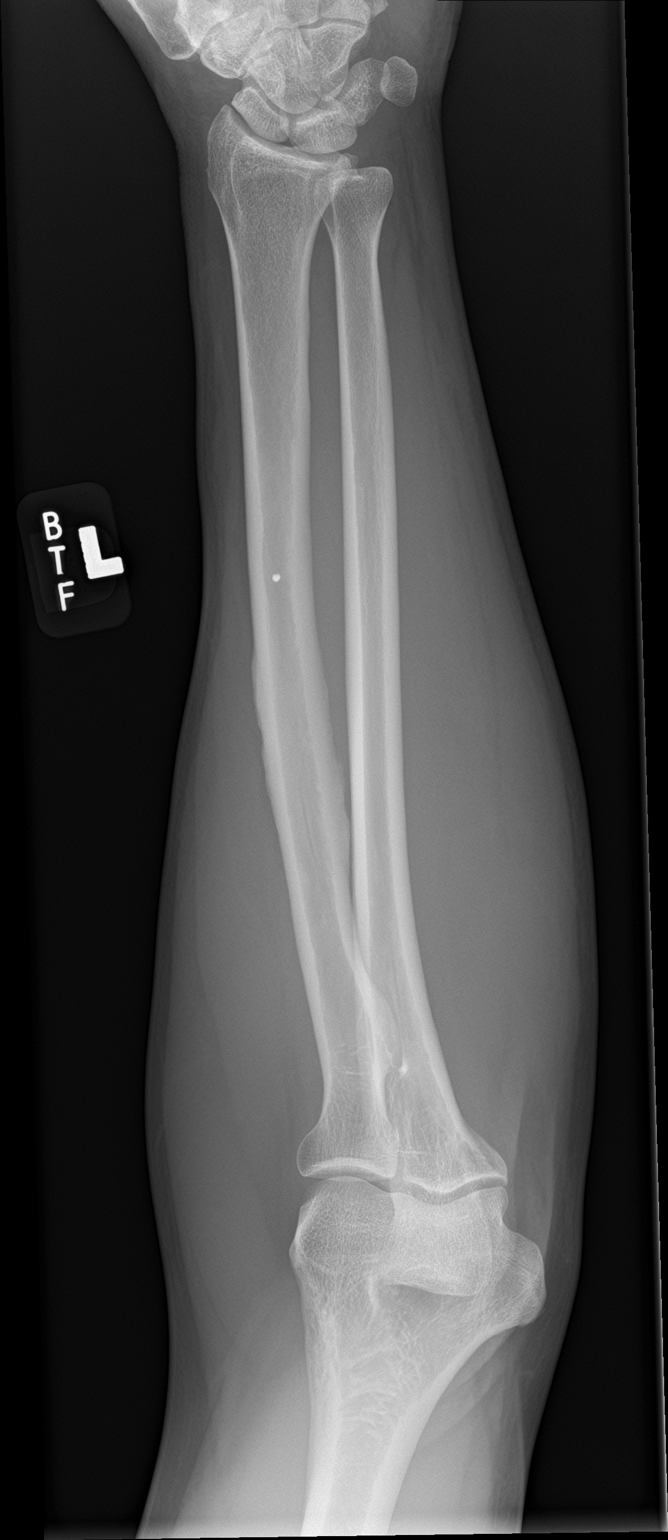

[forearm lat]
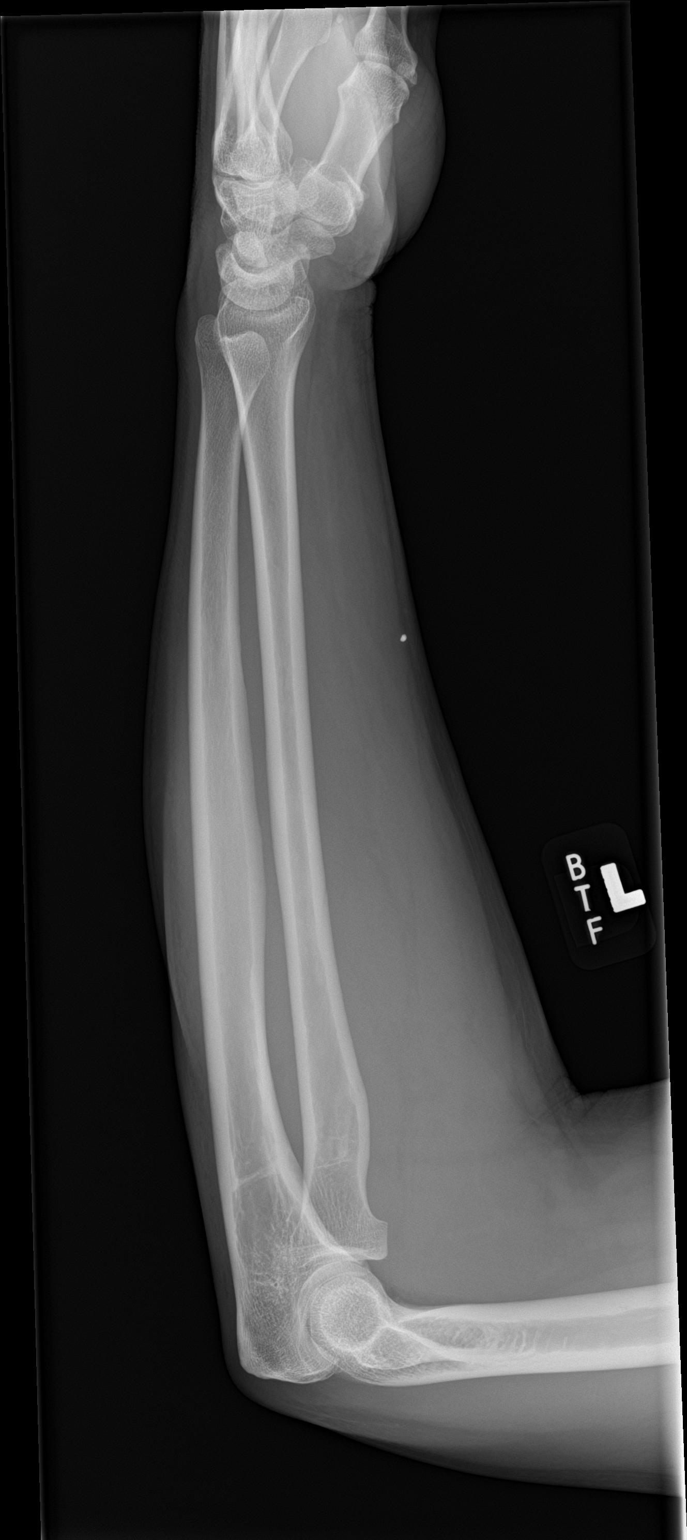

[2 of 2 positions shown; findings below may reference images not displayed]

FINDINGS: There is a punctate 1-2 mm focus of metal within the far superficial
soft tissue of the volar forearm slightly distal to the mid length
of the forearm. No acute fracture or dislocation. No significant
osteoarthritis of the elbow or wrist. No elbow joint effusion.
IMPRESSION: Punctate 1-2 mm focus within the superficial soft tissues of the
slightly distal, volar forearm.

## 2024-04-18 ENCOUNTER — Encounter (INDEPENDENT_AMBULATORY_CARE_PROVIDER_SITE_OTHER): Payer: Self-pay | Admitting: *Deleted
